# Patient Record
Sex: Male | Born: 1940 | Race: Black or African American | Hispanic: No | Smoking: Current some day smoker
Health system: Southern US, Community
[De-identification: ages and names within clinical notes are randomized; demographics above are authoritative.]

## PROBLEM LIST (undated history)

## (undated) DIAGNOSIS — N281 Cyst of kidney, acquired: Secondary | ICD-10-CM

## (undated) DIAGNOSIS — E119 Type 2 diabetes mellitus without complications: Secondary | ICD-10-CM

## (undated) DIAGNOSIS — D649 Anemia, unspecified: Secondary | ICD-10-CM

## (undated) DIAGNOSIS — K279 Peptic ulcer, site unspecified, unspecified as acute or chronic, without hemorrhage or perforation: Secondary | ICD-10-CM

## (undated) DIAGNOSIS — I10 Essential (primary) hypertension: Secondary | ICD-10-CM

## (undated) HISTORY — DX: Essential (primary) hypertension: I10

## (undated) HISTORY — DX: Peptic ulcer, site unspecified, unspecified as acute or chronic, without hemorrhage or perforation: K27.9

## (undated) HISTORY — DX: Type 2 diabetes mellitus without complications: E11.9

## (undated) HISTORY — DX: Anemia, unspecified: D64.9

## (undated) HISTORY — PX: NO PAST SURGERIES: SHX2092

---

## 1998-02-28 ENCOUNTER — Other Ambulatory Visit: Admission: RE | Admit: 1998-02-28 | Discharge: 1998-02-28 | Payer: Self-pay | Admitting: Internal Medicine

## 2009-06-30 ENCOUNTER — Emergency Department (HOSPITAL_COMMUNITY): Admission: EM | Admit: 2009-06-30 | Discharge: 2009-06-30 | Payer: Self-pay | Admitting: Emergency Medicine

## 2009-07-03 ENCOUNTER — Emergency Department (HOSPITAL_COMMUNITY): Admission: EM | Admit: 2009-07-03 | Discharge: 2009-07-03 | Payer: Self-pay | Admitting: Emergency Medicine

## 2011-01-17 ENCOUNTER — Other Ambulatory Visit: Payer: Self-pay | Admitting: Internal Medicine

## 2011-01-17 DIAGNOSIS — R140 Abdominal distension (gaseous): Secondary | ICD-10-CM

## 2011-01-21 ENCOUNTER — Other Ambulatory Visit: Payer: Self-pay | Admitting: Internal Medicine

## 2011-01-21 ENCOUNTER — Ambulatory Visit (HOSPITAL_COMMUNITY)
Admission: RE | Admit: 2011-01-21 | Discharge: 2011-01-21 | Disposition: A | Discharge status: Home / Self Care | Payer: BC Managed Care – PPO | Source: Ambulatory Visit | Attending: Internal Medicine | Admitting: Internal Medicine

## 2011-01-21 DIAGNOSIS — R142 Eructation: Secondary | ICD-10-CM | POA: Insufficient documentation

## 2011-01-21 DIAGNOSIS — R140 Abdominal distension (gaseous): Secondary | ICD-10-CM

## 2011-01-21 DIAGNOSIS — R143 Flatulence: Secondary | ICD-10-CM | POA: Insufficient documentation

## 2011-01-21 DIAGNOSIS — R141 Gas pain: Secondary | ICD-10-CM | POA: Insufficient documentation

## 2011-01-21 DIAGNOSIS — R109 Unspecified abdominal pain: Secondary | ICD-10-CM | POA: Insufficient documentation

## 2011-02-07 ENCOUNTER — Encounter: Payer: Self-pay | Admitting: *Deleted

## 2014-03-28 ENCOUNTER — Other Ambulatory Visit (HOSPITAL_COMMUNITY): Payer: Self-pay | Admitting: Internal Medicine

## 2014-03-28 DIAGNOSIS — M25562 Pain in left knee: Secondary | ICD-10-CM

## 2014-03-28 DIAGNOSIS — I4510 Unspecified right bundle-branch block: Secondary | ICD-10-CM

## 2014-04-05 ENCOUNTER — Ambulatory Visit (HOSPITAL_COMMUNITY)
Admission: RE | Admit: 2014-04-05 | Discharge: 2014-04-05 | Disposition: A | Discharge status: Home / Self Care | Payer: Medicare Other | Source: Ambulatory Visit | Attending: Internal Medicine | Admitting: Internal Medicine

## 2014-04-05 DIAGNOSIS — M25562 Pain in left knee: Secondary | ICD-10-CM

## 2014-04-05 DIAGNOSIS — R609 Edema, unspecified: Secondary | ICD-10-CM | POA: Insufficient documentation

## 2014-04-05 DIAGNOSIS — R0609 Other forms of dyspnea: Secondary | ICD-10-CM | POA: Insufficient documentation

## 2014-04-05 DIAGNOSIS — F17200 Nicotine dependence, unspecified, uncomplicated: Secondary | ICD-10-CM | POA: Insufficient documentation

## 2014-04-05 DIAGNOSIS — I517 Cardiomegaly: Secondary | ICD-10-CM

## 2014-04-05 DIAGNOSIS — M79609 Pain in unspecified limb: Secondary | ICD-10-CM

## 2014-04-05 DIAGNOSIS — R0989 Other specified symptoms and signs involving the circulatory and respiratory systems: Principal | ICD-10-CM | POA: Insufficient documentation

## 2014-04-05 DIAGNOSIS — I379 Nonrheumatic pulmonary valve disorder, unspecified: Secondary | ICD-10-CM | POA: Insufficient documentation

## 2014-04-05 DIAGNOSIS — I4510 Unspecified right bundle-branch block: Secondary | ICD-10-CM

## 2014-04-05 DIAGNOSIS — I10 Essential (primary) hypertension: Secondary | ICD-10-CM | POA: Insufficient documentation

## 2014-04-05 DIAGNOSIS — M109 Gout, unspecified: Secondary | ICD-10-CM | POA: Insufficient documentation

## 2014-04-05 NOTE — Progress Notes (Signed)
*  PRELIMINARY RESULTS* Echocardiogram 2D Echocardiogram has been performed.  Leavy Cella 04/05/2014, 9:34 AM

## 2014-04-05 NOTE — Progress Notes (Signed)
VASCULAR LAB PRELIMINARY  ARTERIAL  ABI completed:    RIGHT    LEFT    PRESSURE WAVEFORM  PRESSURE WAVEFORM  BRACHIAL 124 Triphasic BRACHIAL 127 Triphasic  DP 137 Triphasic DP 165 Triphasic  PT 168 Triphasic PT 159 Triphasic    RIGHT LEFT  ABI 1.32 1.30   Resting ABIs indicate normal arterial flow bilaterally. Patient was walked briskly only for a period of two minutes. He experienced hip pain at forty 42 seconds and was unable to walk after two minutes due to hip pain and lower leg pain and tightness. Immediate ABIs post exercise indicated normal arterial flow with no significant change for resting study. Results do not support claudication.  Alta Goding, RVS 04/05/2014, 10:19 AM

## 2014-05-16 ENCOUNTER — Other Ambulatory Visit: Payer: Self-pay | Admitting: Internal Medicine

## 2014-05-16 DIAGNOSIS — M5430 Sciatica, unspecified side: Secondary | ICD-10-CM

## 2014-06-01 ENCOUNTER — Ambulatory Visit (HOSPITAL_COMMUNITY)
Admission: RE | Admit: 2014-06-01 | Discharge: 2014-06-01 | Disposition: A | Discharge status: Home / Self Care | Payer: Medicare Other | Source: Ambulatory Visit | Attending: Internal Medicine | Admitting: Internal Medicine

## 2014-06-01 DIAGNOSIS — M47817 Spondylosis without myelopathy or radiculopathy, lumbosacral region: Secondary | ICD-10-CM | POA: Diagnosis not present

## 2014-06-01 DIAGNOSIS — M5430 Sciatica, unspecified side: Secondary | ICD-10-CM | POA: Diagnosis not present

## 2014-07-12 ENCOUNTER — Other Ambulatory Visit: Payer: Self-pay | Admitting: Internal Medicine

## 2014-07-12 DIAGNOSIS — N281 Cyst of kidney, acquired: Secondary | ICD-10-CM

## 2014-07-25 ENCOUNTER — Ambulatory Visit (HOSPITAL_COMMUNITY): Admission: RE | Admit: 2014-07-25 | Payer: Medicare Other | Source: Ambulatory Visit

## 2014-11-04 ENCOUNTER — Ambulatory Visit (HOSPITAL_COMMUNITY)
Admission: RE | Admit: 2014-11-04 | Discharge: 2014-11-04 | Disposition: A | Discharge status: Home / Self Care | Payer: Commercial Managed Care - HMO | Source: Ambulatory Visit | Attending: Internal Medicine | Admitting: Internal Medicine

## 2014-11-04 DIAGNOSIS — Q6102 Congenital multiple renal cysts: Secondary | ICD-10-CM | POA: Insufficient documentation

## 2014-11-04 DIAGNOSIS — E279 Disorder of adrenal gland, unspecified: Secondary | ICD-10-CM | POA: Diagnosis not present

## 2014-11-04 DIAGNOSIS — Q6100 Congenital renal cyst, unspecified: Secondary | ICD-10-CM | POA: Diagnosis present

## 2014-11-04 DIAGNOSIS — K7689 Other specified diseases of liver: Secondary | ICD-10-CM | POA: Diagnosis not present

## 2014-11-04 DIAGNOSIS — J9811 Atelectasis: Secondary | ICD-10-CM | POA: Insufficient documentation

## 2014-11-04 DIAGNOSIS — N281 Cyst of kidney, acquired: Secondary | ICD-10-CM

## 2014-11-04 LAB — CREATININE, SERUM
CREATININE: 1.55 mg/dL — AB (ref 0.50–1.35)
GFR, EST AFRICAN AMERICAN: 50 mL/min — AB (ref >90)
GFR, EST NON AFRICAN AMERICAN: 43 mL/min — AB (ref >90)

## 2014-11-04 MED ORDER — GADOBENATE DIMEGLUMINE 529 MG/ML IV SOLN
20.0000 mL | Freq: Once | INTRAVENOUS | Status: AC | PRN
  Administered 2014-11-04: 17 mL via INTRAVENOUS

## 2015-05-08 ENCOUNTER — Other Ambulatory Visit (HOSPITAL_COMMUNITY): Payer: Self-pay | Admitting: Urology

## 2015-05-08 DIAGNOSIS — N281 Cyst of kidney, acquired: Secondary | ICD-10-CM

## 2015-05-26 ENCOUNTER — Ambulatory Visit (HOSPITAL_COMMUNITY)
Admission: RE | Admit: 2015-05-26 | Discharge: 2015-05-26 | Disposition: A | Discharge status: Home / Self Care | Payer: Commercial Managed Care - HMO | Source: Ambulatory Visit | Attending: Urology | Admitting: Urology

## 2015-05-26 DIAGNOSIS — Q619 Cystic kidney disease, unspecified: Secondary | ICD-10-CM | POA: Diagnosis not present

## 2015-05-26 DIAGNOSIS — K760 Fatty (change of) liver, not elsewhere classified: Secondary | ICD-10-CM | POA: Insufficient documentation

## 2015-05-26 DIAGNOSIS — N281 Cyst of kidney, acquired: Secondary | ICD-10-CM | POA: Insufficient documentation

## 2015-05-26 DIAGNOSIS — N289 Disorder of kidney and ureter, unspecified: Secondary | ICD-10-CM | POA: Diagnosis present

## 2015-05-26 LAB — POCT I-STAT CREATININE: CREATININE: 1.7 mg/dL — AB (ref 0.61–1.24)

## 2015-05-26 MED ORDER — GADOBENATE DIMEGLUMINE 529 MG/ML IV SOLN
20.0000 mL | Freq: Once | INTRAVENOUS | Status: AC | PRN
  Administered 2015-05-26: 19 mL via INTRAVENOUS

## 2016-08-06 ENCOUNTER — Other Ambulatory Visit: Payer: Self-pay | Admitting: Urology

## 2016-08-06 DIAGNOSIS — N281 Cyst of kidney, acquired: Secondary | ICD-10-CM

## 2016-08-14 ENCOUNTER — Ambulatory Visit (HOSPITAL_COMMUNITY)
Admission: RE | Admit: 2016-08-14 | Discharge: 2016-08-14 | Disposition: A | Discharge status: Home / Self Care | Payer: Commercial Managed Care - HMO | Source: Ambulatory Visit | Attending: Urology | Admitting: Urology

## 2016-08-14 DIAGNOSIS — N281 Cyst of kidney, acquired: Secondary | ICD-10-CM | POA: Diagnosis present

## 2016-08-14 DIAGNOSIS — D3500 Benign neoplasm of unspecified adrenal gland: Secondary | ICD-10-CM | POA: Diagnosis not present

## 2016-08-14 DIAGNOSIS — K7689 Other specified diseases of liver: Secondary | ICD-10-CM | POA: Insufficient documentation

## 2016-08-14 MED ORDER — GADOBENATE DIMEGLUMINE 529 MG/ML IV SOLN
20.0000 mL | Freq: Once | INTRAVENOUS | Status: AC | PRN
  Administered 2016-08-14: 20 mL via INTRAVENOUS

## 2017-04-16 ENCOUNTER — Other Ambulatory Visit: Payer: Self-pay | Admitting: Urology

## 2017-04-16 DIAGNOSIS — N281 Cyst of kidney, acquired: Secondary | ICD-10-CM

## 2017-04-29 ENCOUNTER — Ambulatory Visit (HOSPITAL_COMMUNITY)
Admission: RE | Admit: 2017-04-29 | Discharge: 2017-04-29 | Disposition: A | Discharge status: Home / Self Care | Payer: Medicare HMO | Source: Ambulatory Visit | Attending: Urology | Admitting: Urology

## 2017-04-29 DIAGNOSIS — N281 Cyst of kidney, acquired: Secondary | ICD-10-CM | POA: Insufficient documentation

## 2017-04-29 DIAGNOSIS — K7689 Other specified diseases of liver: Secondary | ICD-10-CM | POA: Insufficient documentation

## 2017-04-29 LAB — POCT I-STAT CREATININE: Creatinine, Ser: 1.5 mg/dL — ABNORMAL HIGH (ref 0.61–1.24)

## 2017-04-29 MED ORDER — GADOBENATE DIMEGLUMINE 529 MG/ML IV SOLN
20.0000 mL | Freq: Once | INTRAVENOUS | Status: AC | PRN
  Administered 2017-04-29: 20 mL via INTRAVENOUS

## 2017-05-18 ENCOUNTER — Encounter (HOSPITAL_COMMUNITY): Payer: Self-pay | Admitting: Emergency Medicine

## 2017-05-18 ENCOUNTER — Emergency Department (HOSPITAL_COMMUNITY): Payer: Medicare HMO

## 2017-05-18 ENCOUNTER — Emergency Department (HOSPITAL_COMMUNITY)
Admission: EM | Admit: 2017-05-18 | Discharge: 2017-05-18 | Disposition: A | Discharge status: Home / Self Care | Payer: Medicare HMO | Attending: Emergency Medicine | Admitting: Emergency Medicine

## 2017-05-18 DIAGNOSIS — F17200 Nicotine dependence, unspecified, uncomplicated: Secondary | ICD-10-CM | POA: Insufficient documentation

## 2017-05-18 DIAGNOSIS — K5900 Constipation, unspecified: Secondary | ICD-10-CM | POA: Insufficient documentation

## 2017-05-18 DIAGNOSIS — Z79899 Other long term (current) drug therapy: Secondary | ICD-10-CM | POA: Diagnosis not present

## 2017-05-18 DIAGNOSIS — K6289 Other specified diseases of anus and rectum: Secondary | ICD-10-CM | POA: Diagnosis not present

## 2017-05-18 HISTORY — DX: Cyst of kidney, acquired: N28.1

## 2017-05-18 LAB — CBC WITH DIFFERENTIAL/PLATELET
Basophils Absolute: 0 10*3/uL (ref 0.0–0.1)
Basophils Relative: 0 %
EOS PCT: 1 %
Eosinophils Absolute: 0.1 10*3/uL (ref 0.0–0.7)
HCT: 35.8 % — ABNORMAL LOW (ref 39.0–52.0)
Hemoglobin: 11.9 g/dL — ABNORMAL LOW (ref 13.0–17.0)
LYMPHS PCT: 12 %
Lymphs Abs: 1.5 10*3/uL (ref 0.7–4.0)
MCH: 25.7 pg — ABNORMAL LOW (ref 26.0–34.0)
MCHC: 33.2 g/dL (ref 30.0–36.0)
MCV: 77.3 fL — ABNORMAL LOW (ref 78.0–100.0)
Monocytes Absolute: 0.9 10*3/uL (ref 0.1–1.0)
Monocytes Relative: 8 %
Neutro Abs: 9.4 10*3/uL — ABNORMAL HIGH (ref 1.7–7.7)
Neutrophils Relative %: 79 %
PLATELETS: 273 10*3/uL (ref 150–400)
RBC: 4.63 MIL/uL (ref 4.22–5.81)
RDW: 13.8 % (ref 11.5–15.5)
WBC: 11.9 10*3/uL — AB (ref 4.0–10.5)

## 2017-05-18 LAB — COMPREHENSIVE METABOLIC PANEL
ALT: 18 U/L (ref 17–63)
AST: 17 U/L (ref 15–41)
Albumin: 3.5 g/dL (ref 3.5–5.0)
Alkaline Phosphatase: 68 U/L (ref 38–126)
Anion gap: 7 (ref 5–15)
BUN: 34 mg/dL — ABNORMAL HIGH (ref 6–20)
CHLORIDE: 108 mmol/L (ref 101–111)
CO2: 22 mmol/L (ref 22–32)
CREATININE: 1.73 mg/dL — AB (ref 0.61–1.24)
Calcium: 9.4 mg/dL (ref 8.9–10.3)
GFR calc Af Amer: 43 mL/min — ABNORMAL LOW (ref >60)
GFR calc non Af Amer: 37 mL/min — ABNORMAL LOW (ref >60)
Glucose, Bld: 109 mg/dL — ABNORMAL HIGH (ref 65–99)
Potassium: 3.9 mmol/L (ref 3.5–5.1)
SODIUM: 137 mmol/L (ref 135–145)
Total Bilirubin: 0.7 mg/dL (ref 0.3–1.2)
Total Protein: 7.4 g/dL (ref 6.5–8.1)

## 2017-05-18 MED ORDER — MILK AND MOLASSES ENEMA
1.0000 | Freq: Once | RECTAL | Status: AC
  Administered 2017-05-18: 250 mL via RECTAL
  Filled 2017-05-18: qty 250

## 2017-05-18 NOTE — Discharge Instructions (Signed)
Drink plenty of fluids to prevent constipation. Use miralax, on cap daily until normal bowel movements. Return to ED if worsening symptoms.

## 2017-05-18 NOTE — ED Notes (Signed)
Patient transported to X-ray 

## 2017-05-18 NOTE — ED Provider Notes (Signed)
[facility] DEPT Provider Note   CSN: [id number] Arrival date & time: 05/18/17  1011     History   Chief Complaint Chief Complaint  Patient presents with  . Constipation  . Hemorrhoids    HPI Raymond Pope is a 75 y.o. male.  HPI Raymond Pope is a 76 y.o. male with history of hypertension,recently diagnosed diabetes, history of renal cysts, presents to emergency department complaining of constipation.atient states that he has felt bloated and felt pressure in his abdomen for about a week. He states he only had 1 small bowel movement over the last week. He has tried a few sips of magnesium citrate which did not help. He denies any nausea or vomiting. He states he does have decreased appetite because he feels full. He reports some rectal pain and thinks he may have hemorrhoids. Denies any blood in his stool. Denies blood when he wipes. No urinary symptoms. No fever or chills. Drinks alcohol occasionally. Does not take any opiate medications. No change in his recent medications other than new diabetes medicine a month ago.  Past Medical History:  Diagnosis Date  . Renal cyst     There are no active problems to display for this patient.   History reviewed. No pertinent surgical history.     Home Medications    Prior to Admission medications   Medication Sig Start Date End Date Taking? Authorizing Provider  amLODipine-valsartan (EXFORGE) 10-160 MG per tablet Take 1 tablet by mouth daily.      [provider]  dexlansoprazole (DEXILANT) 60 MG capsule Take 60 mg by mouth daily.      [provider]  Tamsulosin HCl (FLOMAX) 0.4 MG CAPS Take 0.4 mg by mouth 1 dose over 24 hours.      [provider]    Family History History reviewed. No pertinent family history.  Social History Social History  Substance Use Topics  . Smoking status: Current Every Day Smoker    Types: Pipe  . Smokeless tobacco: Never Used  . Alcohol use No      Allergies   Patient has no known allergies.   Review of Systems Review of Systems  Constitutional: Negative for chills and fever.  Respiratory: Negative for cough, chest tightness and shortness of breath.   Cardiovascular: Negative for chest pain, palpitations and leg swelling.  Gastrointestinal: Positive for abdominal distention, abdominal pain, constipation and rectal pain. Negative for blood in stool, diarrhea, nausea and vomiting.  Genitourinary: Negative for dysuria, frequency, hematuria and urgency.  Musculoskeletal: Negative for arthralgias, myalgias, neck pain and neck stiffness.  Skin: Negative for rash.  Allergic/Immunologic: Negative for immunocompromised state.  Neurological: Negative for dizziness, weakness, light-headedness, numbness and headaches.  All other systems reviewed and are negative.    Physical Exam Updated Vital Signs BP 132/88   Pulse 87   Temp 98.1 F (36.7 C)   Resp 18   SpO2 99%   Physical Exam  Constitutional: He appears well-developed and well-nourished. No distress.  HENT:  Head: Normocephalic and atraumatic.  Eyes: Conjunctivae are normal.  Neck: Neck supple.  Cardiovascular: Normal rate, regular rhythm and normal heart sounds.   Pulmonary/Chest: Effort normal. No respiratory distress. He has no wheezes. He has no rales.  Abdominal: Soft. Bowel sounds are normal. He exhibits distension. There is no tenderness. There is no rebound.  Musculoskeletal: He exhibits no edema.  Neurological: He is alert.  Skin: Skin is warm and dry.  Nursing note and vitals reviewed.  ED Treatments / Results  Labs (all labs ordered are listed, but only abnormal results are displayed) Labs Reviewed  CBC WITH DIFFERENTIAL/PLATELET  COMPREHENSIVE METABOLIC PANEL    EKG  EKG Interpretation None       Radiology No results found.  Procedures Procedures (including critical care time)  Medications Ordered in ED Medications - No data to  display   Initial Impression / Assessment and Plan / ED Course  I have reviewed the triage vital signs and the nursing notes.  Pertinent labs & imaging results that were available during my care of the patient were reviewed by me and considered in my medical decision making (see chart for details).     Patient seen and examined. Patient with constipation. Abdomen is nontender does appear to be distended. We'll get an abdominal x-ray and perform rectal exam. Will get basic labs due to his age to assess for dehydration, electrolyte abnormalities, blood counts.   2:00 PM Abdominal x-ray showing mild fecal loading in the colon, rectal exam showed fecal impaction. I'm unable to disimpact due to pain to discomfort. We'll try an enema.  3:22 PM And received an enema, he had a large bowel movement. He states he feels much better. When I went to reassess him he was sitting on the site chair fully dressed stating that he is ready to go home and he feels back to normal. His abdomen is soft. Non tender. Discussed with Dr. Regenia Skeeter who has seen patient as well, agrees with plan to discharge home. Advised to take MiraLAX daily until normal bowel movements. Follow-up with family doctor. Return precautions discussed.  Vitals:   05/18/17 1018 05/18/17 1220 05/18/17 1422  BP: 133/85 132/88 122/83  Pulse: (!) 103 87 81  Resp: 15 18 16   Temp: 98.1 F (36.7 C)    SpO2: 99% 99% 100%      Final Clinical Impressions(s) / ED Diagnoses   Final diagnoses:  Constipation  Constipation, unspecified constipation type    New Prescriptions New Prescriptions   No medications on file     Jeannett Senior, Hershal Coria 05/18/17 2039    Sherwood Gambler, MD 05/20/17 1226

## 2017-05-18 NOTE — ED Triage Notes (Signed)
Pt c/o constipation and hemorrhoids, patient states he has had one bowel movement in the past week. Pt states abdomen feels bloated and tightness throughout abdomen.

## 2017-05-19 ENCOUNTER — Encounter: Payer: Medicare HMO | Attending: Internal Medicine | Admitting: Skilled Nursing Facility1

## 2017-05-19 ENCOUNTER — Encounter: Payer: Self-pay | Admitting: Skilled Nursing Facility1

## 2017-05-19 DIAGNOSIS — E118 Type 2 diabetes mellitus with unspecified complications: Secondary | ICD-10-CM | POA: Diagnosis present

## 2017-05-19 DIAGNOSIS — E119 Type 2 diabetes mellitus without complications: Secondary | ICD-10-CM

## 2017-05-19 NOTE — Patient Instructions (Addendum)
-  Use splenda in coffee and lemonade   -Use diet soda instead of regular   -Before you eat anything in the morning: around 130  -2 hours after you eat around 180  -Below 70 is low fix it!  -High over 200 blood sugar  -Check your blood sugar 1 time a  Day either in the morning before you eat anything or 2 hours after you have eaten a meal  -Write down your numbers you get, if they are out of range right down what you ate 2 hours before   -Start trying to eat something for breakfast every day

## 2017-05-19 NOTE — Progress Notes (Signed)
Diabetes Self-Management Education  Visit Type: First/Initial   05/19/2017  Mr. Raymond Pope, identified by name and date of birth, is a 76 y.o. male with a diagnosis of Diabetes: Type 2.   ASSESSMENT  Height 5\' 11"  (1.803 m), weight 210 lb (95.3 kg). Body mass index is 29.29 kg/m.   Pt arrives with his daughter who has type 2 diabetes and fell asleep a couple times in the appointment one time her father waking her up. Pt has trouble deciphering consitancy. Pt states he wakes up in the middle of the night to eat sometimes because he wakes up in the middle of the night to urinate.  Pt checked his blood sugar: 110 having eaten some sausage.  Pt given accu-chek guide lot: [id number] exp: 12-18-17  To discuss next time: type 1 verses type 2, CHO counting, and reviewing of blood glucose log.      Diabetes Self-Management Education - 05/19/17 1035      Visit Information   Visit Type First/Initial     Initial Visit   Diabetes Type Type 2   Are you currently following a meal plan? No   Are you taking your medications as prescribed? Yes     Health Coping   How would you rate your overall health? Good     Psychosocial Assessment   Patient Belief/Attitude about Diabetes Motivated to manage diabetes   Self-management support Family   Other persons present Family Member   Patient Concerns Nutrition/Meal planning     Pre-Education Assessment   Patient understands the diabetes disease and treatment process. Needs Instruction   Patient understands incorporating nutritional management into lifestyle. Needs Instruction   Patient undertands incorporating physical activity into lifestyle. Needs Instruction   Patient understands using medications safely. Needs Instruction   Patient understands monitoring blood glucose, interpreting and using results Needs Instruction   Patient understands prevention, detection, and treatment of acute complications. Needs Instruction   Patient understands  prevention, detection, and treatment of chronic complications. Needs Instruction   Patient understands how to develop strategies to address psychosocial issues. Needs Instruction   Patient understands how to develop strategies to promote health/change behavior. Needs Instruction     Complications   Last HgB A1C per patient/outside source 9.5 %   How often do you check your blood sugar? 0 times/day (not testing)   Have you had a dilated eye exam in the past 12 months? No   Have you had a dental exam in the past 12 months? No   Are you checking your feet? No     Dietary Intake   Breakfast none   Lunch 12pm: chicken or pork chop or sausage, beans somtimes green leafys mostly just meat and beans    Snack (afternoon) peanutbutter and oatmeal cookies or bread    Dinner meat and beans    Snack (evening) somtimes in the middle of the night eating    Beverage(s) water, regular clear soda, coffee cream and sugar, lemonade     Patient Education   Previous Diabetes Education No   Nutrition management  Role of diet in the treatment of diabetes and the relationship between the three main macronutrients and blood glucose level;Carbohydrate counting;Reviewed blood glucose goals for pre and post meals and how to evaluate the patients' food intake on their blood glucose level.   Physical activity and exercise  Role of exercise on diabetes management, blood pressure control and cardiac health.   Monitoring Taught/evaluated SMBG meter.;Purpose and frequency of SMBG.;Identified appropriate  SMBG and/or A1C goals.     Outcomes   Expected Outcomes Demonstrated interest in learning. Expect positive outcomes   Future DMSE 2 wks   Program Status Completed      Individualized Plan for Diabetes Self-Management Training:   Learning Objective:  Patient will have a greater understanding of diabetes self-management. Patient education plan is to attend individual and/or group sessions per assessed needs and  concerns.   Plan:   Patient Instructions  -Use splenda in coffee and lemonade   -Use diet soda instead of regular   -Before you eat anything in the morning: around 130  -2 hours after you eat around 180  -Below 70 is low fix it!  -High over 200 blood sugar  -Check your blood sugar 1 time a  Day either in the morning before you eat anything or 2 hours after you have eaten a meal  -Write down your numbers you get, if they are out of range right down what you ate 2 hours before   -Start trying to eat something for breakfast every day   Expected Outcomes:  Demonstrated interest in learning. Expect positive outcomes  Education material provided: Living Well with Diabetes and A1C conversion sheet  If problems or questions, patient to contact team via:  Phone  Future DSME appointment: 2 wks

## 2017-05-20 ENCOUNTER — Ambulatory Visit: Payer: Commercial Managed Care - HMO

## 2017-05-27 ENCOUNTER — Ambulatory Visit: Payer: Commercial Managed Care - HMO

## 2017-06-03 ENCOUNTER — Ambulatory Visit: Payer: Commercial Managed Care - HMO

## 2017-06-04 ENCOUNTER — Encounter: Payer: Self-pay | Admitting: Skilled Nursing Facility1

## 2017-06-04 ENCOUNTER — Encounter: Payer: Medicare HMO | Attending: Internal Medicine | Admitting: Skilled Nursing Facility1

## 2017-06-04 DIAGNOSIS — E119 Type 2 diabetes mellitus without complications: Secondary | ICD-10-CM

## 2017-06-04 DIAGNOSIS — E118 Type 2 diabetes mellitus with unspecified complications: Secondary | ICD-10-CM | POA: Insufficient documentation

## 2017-06-04 NOTE — Patient Instructions (Addendum)
-  Check your blood sugar in the morning before you eat anything every Monday and Friday  -And then all the other days check 2 hours after a meal  -Yogurt: 15 grams or less of carbohydrate, 9 or less grams of fat and 9 or less grams of sugar, 8 grams or more of protein

## 2017-06-04 NOTE — Progress Notes (Signed)
  Diabetes Self-Management Education   06/04/2017  Mr. Raymond Pope, identified by name and date of birth, is a 76 y.o. male with a diagnosis of Diabetes:  .     Pt arrives with his daughter. Pts daughter states she checks her fathers blood sugar for him numbers tracked: 181, 109, 119, 209, 110 (pt could not answer when these numbers were taken. Pts daughter states they did not take her fathers blood sugar often because they did not know how to use the pts prescribed lancing device. When dietitian asked a series of questions to better understand what the bridge was that needed to be gapped the pts daughter said she did not like this line of questions: Dietitian responded she needed to know what they did not understand so she could help them, the daughter then got less defensive and answered the questions. Pt and his daughter were taught how to use his meter, carbohydrate counting as well as hypoglycemia prevention and treatment. Pt states all he wants to do is control his diabetes so he will do carbohydrate counting and do it well. Pts daughter states she cannot get over to her fathers house to test his fasting sugars, when dietitian asked the pt if he could test his fasting he said absolutely. Pt states he gets up in the middle of the night to have a snack.  To discuss next time: log review and checking feet  Goals: -Check your blood sugar in the morning before you eat anything every Monday and Friday -And then all the other days check 2 hours after a meal -Yogurt: 15 grams or less of carbohydrate, 9 or less grams of fat and 9 or less grams of sugar, 8 grams or more of protein -Bring your log of blood sugars for the next appointment

## 2017-07-16 ENCOUNTER — Encounter: Payer: Medicare HMO | Attending: Internal Medicine | Admitting: Skilled Nursing Facility1

## 2017-07-16 ENCOUNTER — Encounter: Payer: Self-pay | Admitting: Skilled Nursing Facility1

## 2017-07-16 DIAGNOSIS — E119 Type 2 diabetes mellitus without complications: Secondary | ICD-10-CM

## 2017-07-16 DIAGNOSIS — E118 Type 2 diabetes mellitus with unspecified complications: Secondary | ICD-10-CM | POA: Diagnosis not present

## 2017-07-16 NOTE — Progress Notes (Signed)
  Diabetes Self-Management Education   07/16/2017  Mr. Raymond Pope, identified by name and date of birth, is a 76 y.o. male with a diagnosis of Diabetes:  .   To discuss next time:checking feet and smoking  Pt states he ran out of janumet for about a week.  Glucose readings: 140-183 2 hours after eating. Pt did have a few over 200 not remembering what happened on those days. Pt states he took four flights of stairs for today's appointment. Pt states he skips breakfast most days of the week. Pt admits to taking half a janumet every other day due to how expensive the meds are.   Pt does smoke.   Dietary Recall: Sundays: preparing meals for 2-3 days into the week: [place] necks with rice, fresh greens and lima beans   Beverages: coffee, lemonade   Goals:  -Try to eat within 1 to 1.5 hours of waking: fruit and nuts OR fruit and peanut butter OR fruit and cheese OR a slice of cheese toast OR yogurt   -Talk to your doctor about your medication being too expensive   -Try to eat about every 5 hours   -Take your medication as prescribed until you can get in with your doctor   -For your lemonade read the nutrition facts label and look for 9 grams or less of sugar   -Look for sugar free on your lemonade front label   -when your numbers are over 200 write down what you had to eat a couple hours before that and whether you had a stressful day or not

## 2017-07-16 NOTE — Patient Instructions (Addendum)
-  Try to eat within 1 to 1.5 hours of waking: fruit and nuts OR fruit and peanut butter OR fruit and cheese OR a slice of cheese toast OR yogurt   -Talk to your doctor about your medication being too expensive   -Try to eat about every 5 hours   -Take your medication as prescribed until you can get in with your doctor   -For your lemonade read the nutrition facts label and look for 9 grams or less of sugar   -Look for sugar free on your lemonade front label   -when your numbers are over 200 write down what you had to eat a couple hours before that and whether you had a stressful day or not

## 2017-10-16 ENCOUNTER — Encounter: Payer: Medicare HMO | Attending: Internal Medicine | Admitting: Skilled Nursing Facility1

## 2017-10-16 ENCOUNTER — Encounter: Payer: Self-pay | Admitting: Skilled Nursing Facility1

## 2017-10-16 DIAGNOSIS — E118 Type 2 diabetes mellitus with unspecified complications: Secondary | ICD-10-CM | POA: Diagnosis not present

## 2017-10-16 DIAGNOSIS — Z713 Dietary counseling and surveillance: Secondary | ICD-10-CM | POA: Diagnosis present

## 2017-10-16 DIAGNOSIS — E119 Type 2 diabetes mellitus without complications: Secondary | ICD-10-CM

## 2017-10-16 NOTE — Progress Notes (Signed)
  Diabetes Self-Management Education   10/16/2017  Mr. Raymond Pope, identified by name and date of birth, is a 77 y.o. male with a diagnosis of Diabetes:  .    Pt states he has been working on lower priced medications with his Building surveyor. Pt states his doctor is retiring so he is trying to pick out a doctor from [place]. Pt states he sometimes feels dizzy in the morning. Pt states he notices every once and while he notices tingling in his feet. Pt states he has to urinate often throughout the night stating he drinks up until he goes to bed. Pt was surprised at the effects smoking can have on his body and states he will try to cut back.   Pts blood sugars: not knowing if they are before or after eating: 130-294 Had pt write down, "if my numbers are over 200 I will write down what I ate that day and if that day was particularly stressful I will also drink 2 glasses of water and march in place for 10 minutes" also had pt write down weather it was before he has eaten or after he has eaten. Also check my blood sugar in the morning when I am feeling dizzy and if low have fast acting sugar. Also, get an appointment with a podiatrist.  Dietary Recall: eating fresh vegetables from his garden throughout the day  Cereal or 2 eggs and 2 toast Coffee or hot chocolate [place] necks and rice or neck bones  [place] necks and rice or neck bones   Beverages: coffee, lemonade, sugar free natures twist, water 80 oz

## 2017-12-12 ENCOUNTER — Other Ambulatory Visit: Payer: Self-pay | Admitting: Urology

## 2017-12-12 DIAGNOSIS — N281 Cyst of kidney, acquired: Secondary | ICD-10-CM

## 2017-12-15 ENCOUNTER — Encounter: Payer: Self-pay | Admitting: Family Medicine

## 2017-12-15 ENCOUNTER — Ambulatory Visit (INDEPENDENT_AMBULATORY_CARE_PROVIDER_SITE_OTHER): Payer: Medicare HMO | Admitting: Family Medicine

## 2017-12-15 VITALS — BP 122/80 | HR 96 | Ht 71.0 in | Wt 210.2 lb

## 2017-12-15 DIAGNOSIS — Z23 Encounter for immunization: Secondary | ICD-10-CM | POA: Diagnosis not present

## 2017-12-15 DIAGNOSIS — E119 Type 2 diabetes mellitus without complications: Secondary | ICD-10-CM | POA: Insufficient documentation

## 2017-12-15 DIAGNOSIS — I10 Essential (primary) hypertension: Secondary | ICD-10-CM | POA: Diagnosis not present

## 2017-12-15 DIAGNOSIS — Z720 Tobacco use: Secondary | ICD-10-CM

## 2017-12-15 DIAGNOSIS — N400 Enlarged prostate without lower urinary tract symptoms: Secondary | ICD-10-CM

## 2017-12-15 NOTE — Progress Notes (Signed)
Subjective:  Patient ID: Raymond Pope, male    DOB: [birth year]  Age: 77 y.o. MRN: [id number]  CC: Establish Care   HPI Raymond Pope presents for establishment of care and evaluation of his hypertension, diabetes, BPH.  His controlled on his current medical regimen regimen he tells me.  He is nonfasting today.  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  An MRI has been scheduled for follow-up of these in June of this year.  He has been smoking a pipe for 20 years and is interested in quitting smoking.  He is retired from work with [employer].  He lives alone.  There are grown children in the area who come by and check on him.  He rarely drinks alcohol and does not use illicit drugs.  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  He brings in a list of blood sugars but are mostly 2 hours post lunch and they are in the less than 150 range.  He is currently not having any problems with any of his medicines.  History Raymond Pope has a past medical history of Diabetes mellitus without complication ([place]) and Renal cyst.   He has no past surgical history on file.   His family history is not on file.He reports that he has been smoking pipe.  he has never used smokeless tobacco. He reports that he does not drink alcohol or use drugs.  Outpatient Medications Prior to Visit  Medication Sig Dispense Refill  . amLODipine (NORVASC) 10 MG tablet Take 10 mg by mouth daily.     Marland Kitchen aspirin EC 81 MG tablet Take 81 mg by mouth daily.    Marland Kitchen losartan-hydrochlorothiazide (HYZAAR) 100-12.5 MG tablet Take 1 tablet by mouth daily.     . SitaGLIPtin-MetFORMIN HCl (JANUMET XR) 50-500 MG TB24 Take 1 tablet by mouth 2 (two) times daily.     . Tamsulosin HCl (FLOMAX) 0.4 MG CAPS Take 0.4 mg by mouth 1 dose over 24 hours.      Marland Kitchen ACCU-CHEK AVIVA PLUS test strip   0  . ACCU-CHEK SOFTCLIX LANCETS lancets   0  .  amLODipine-valsartan (EXFORGE) 10-160 MG per tablet Take 1 tablet by mouth daily.      Marland Kitchen dexlansoprazole (DEXILANT) 60 MG capsule Take 60 mg by mouth daily.       No facility-administered medications prior to visit.     ROS Review of Systems  Constitutional: Negative for chills, fatigue and unexpected weight change.  HENT: Negative.   Eyes: Negative.   Respiratory: Negative.   Cardiovascular: Negative.   Gastrointestinal: Negative.   Endocrine: Negative for polyphagia.  Allergic/Immunologic: Negative for immunocompromised state.  Neurological: Negative for weakness and headaches.  Hematological: Does not bruise/bleed easily.  Psychiatric/Behavioral: Negative.     Objective:  BP 122/80 (BP Location: Left Arm, Patient Position: Sitting, Cuff Size: Normal)   Pulse 96   Ht 5\' 11"  (1.803 m)   Wt 210 lb 4 oz (95.4 kg)   SpO2 97%   BMI 29.32 kg/m   Physical Exam  Constitutional: He is oriented to person, place, and time. He appears well-developed and well-nourished. No distress.  HENT:  Head: Normocephalic and atraumatic.  Right Ear: External ear normal.  Left Ear: External ear normal.  Eyes: Right eye exhibits no discharge. Left eye exhibits no discharge. No scleral icterus.  Neck: No JVD present. No tracheal deviation present.  Pulmonary/Chest:  Effort normal. No stridor.  Neurological: He is alert and oriented to person, place, and time.  Skin: Skin is warm and dry. He is not diaphoretic.  Psychiatric: He has a normal mood and affect. His behavior is normal.      Assessment & Plan:   Raymond Pope was seen today for establish care.  Diagnoses and all orders for this visit:  Essential hypertension  Controlled type 2 diabetes mellitus without complication, without long-term current use of insulin (HCC)  Benign prostatic hyperplasia, unspecified whether lower urinary tract symptoms present  Tobacco use   I have discontinued Raymond Pope's amLODipine-valsartan,  dexlansoprazole, ACCU-CHEK AVIVA PLUS, and ACCU-CHEK SOFTCLIX LANCETS. I am also having him maintain his tamsulosin, amLODipine, losartan-hydrochlorothiazide, aspirin EC, and SitaGLIPtin-MetFORMIN HCl.  No orders of the defined types were placed in this encounter.  He will be scheduled to return fasting for a complete physical exam.  We had a discussion on the importance of stopping tobacco use.  He said help with this and using lozenges and/or gum.  Information was also given to him about quitting smoking and the importance of quitting smoking for his long-term health.  Follow-up: Return will return for physical.  Libby Maw, MD

## 2017-12-15 NOTE — Patient Instructions (Addendum)
Health Risks of Smoking Smoking cigarettes is very bad for your health. Tobacco smoke has over 200 known poisons in it. It contains the poisonous gases nitrogen oxide and carbon monoxide. There are over 60 chemicals in tobacco smoke that cause cancer. Smoking is difficult to quit because a chemical in tobacco, called nicotine, causes addiction or dependence. When you smoke and inhale, nicotine is absorbed rapidly into the bloodstream through your lungs. Both inhaled and non-inhaled nicotine may be addictive. What are the risks of cigarette smoke? Cigarette smokers have an increased risk of many serious medical problems, including:  Lung cancer.  Lung disease, such as pneumonia, bronchitis, and emphysema.  Chest pain (angina) and heart attack because the heart is not getting enough oxygen.  Heart disease and peripheral blood vessel disease.  High blood pressure (hypertension).  Stroke.  Oral cancer, including cancer of the lip, mouth, or voice box.  Bladder cancer.  Pancreatic cancer.  Cervical cancer.  Pregnancy complications, including premature birth.  Stillbirths and smaller newborn babies, birth defects, and genetic damage to sperm.  Early menopause.  Lower estrogen level for women.  Infertility.  Facial wrinkles.  Blindness.  Increased risk of broken bones (fractures).  Senile dementia.  Stomach ulcers and internal bleeding.  Delayed wound healing and increased risk of complications during surgery.  Even smoking lightly shortens your life expectancy by several years.  Because of secondhand smoke exposure, children of smokers have an increased risk of the following:  Sudden infant death syndrome (SIDS).  Respiratory infections.  Lung cancer.  Heart disease.  Ear infections.  What are the benefits of quitting? There are many health benefits of quitting smoking. Here are some of them:  Within days of quitting smoking, your risk of having a heart  attack decreases, your blood flow improves, and your lung capacity improves. Blood pressure, pulse rate, and breathing patterns start returning to normal soon after quitting.  Within months, your lungs may clear up completely.  Quitting for 10 years reduces your risk of developing lung cancer and heart disease to almost that of a nonsmoker.  People who quit may see an improvement in their overall quality of life.  How do I quit smoking? Smoking is an addiction with both physical and psychological effects, and longtime habits can be hard to change. Your health care provider can recommend:  Programs and community resources, which may include group support, education, or talk therapy.  Prescription medicines to help reduce cravings.  Nicotine replacement products, such as patches, gum, and nasal sprays. Use these products only as directed. Do not replace cigarette smoking with electronic cigarettes, which are commonly called e-cigarettes. The safety of e-cigarettes is not known, and some may contain harmful chemicals.  A combination of two or more of these methods.  Where to find more information:  American Lung Association: www.lung.org  American Cancer Society: www.cancer.org Summary  Smoking cigarettes is very bad for your health. Cigarette smokers have an increased risk of many serious medical problems, including several cancers, heart disease, and stroke.  Smoking is an addiction with both physical and psychological effects, and longtime habits can be hard to change.  By stopping right away, you can greatly reduce the risk of medical problems for you and your family.  To help you quit smoking, your health care provider can recommend programs, community resources, prescription medicines, and nicotine replacement products such as patches, gum, and nasal sprays. This information is not intended to replace advice given to you by your health   care provider. Make sure you discuss any  questions you have with your health care provider. Document Released: 10/24/2004 Document Revised: 09/20/2016 Document Reviewed: 09/20/2016 Elsevier Interactive Patient Education  2017 [employer].  Steps to Quit Smoking Smoking tobacco can be harmful to your health and can affect almost every organ in your body. Smoking puts you, and those around you, at risk for developing many serious chronic diseases. Quitting smoking is difficult, but it is one of the best things that you can do for your health. It is never too late to quit. What are the benefits of quitting smoking? When you quit smoking, you lower your risk of developing serious diseases and conditions, such as:  Lung cancer or lung disease, such as COPD.  Heart disease.  Stroke.  Heart attack.  Infertility.  Osteoporosis and bone fractures.  Additionally, symptoms such as coughing, wheezing, and shortness of breath may get better when you quit. You may also find that you get sick less often because your body is stronger at fighting off colds and infections. If you are pregnant, quitting smoking can help to reduce your chances of having a baby of low birth weight. How do I get ready to quit? When you decide to quit smoking, create a plan to make sure that you are successful. Before you quit:  Pick a date to quit. Set a date within the next two weeks to give you time to prepare.  Write down the reasons why you are quitting. Keep this list in places where you will see it often, such as on your bathroom mirror or in your car or wallet.  Identify the people, places, things, and activities that make you want to smoke (triggers) and avoid them. Make sure to take these actions: ? Throw away all cigarettes at home, at work, and in your car. ? Throw away smoking accessories, such as Scientist, research (medical). ? Clean your car and make sure to empty the ashtray. ? Clean your home, including curtains and carpets.  Tell your family,  friends, and coworkers that you are quitting. Support from your loved ones can make quitting easier.  Talk with your health care provider about your options for quitting smoking.  Find out what treatment options are covered by your health insurance.  What strategies can I use to quit smoking? Talk with your healthcare provider about different strategies to quit smoking. Some strategies include:  Quitting smoking altogether instead of gradually lessening how much you smoke over a period of time. Research shows that quitting "cold [place]" is more successful than gradually quitting.  Attending in-person counseling to help you build problem-solving skills. You are more likely to have success in quitting if you attend several counseling sessions. Even short sessions of 10 minutes can be effective.  Finding resources and support systems that can help you to quit smoking and remain smoke-free after you quit. These resources are most helpful when you use them often. They can include: ? Online chats with a Social worker. ? Telephone quitlines. ? Careers information officer. ? Support groups or group counseling. ? Text messaging programs. ? Mobile phone applications.  Taking medicines to help you quit smoking. (If you are pregnant or breastfeeding, talk with your health care provider first.) Some medicines contain nicotine and some do not. Both types of medicines help with cravings, but the medicines that include nicotine help to relieve withdrawal symptoms. Your health care provider may recommend: ? Nicotine patches, gum, or lozenges. ? Nicotine inhalers or sprays. ?  Non-nicotine medicine that is taken by mouth.  Talk with your health care provider about combining strategies, such as taking medicines while you are also receiving in-person counseling. Using these two strategies together makes you more likely to succeed in quitting than if you used either strategy on its own. If you are pregnant or  breastfeeding, talk with your health care provider about finding counseling or other support strategies to quit smoking. Do not take medicine to help you quit smoking unless told to do so by your health care provider. What things can I do to make it easier to quit? Quitting smoking might feel overwhelming at first, but there is a lot that you can do to make it easier. Take these important actions:  Reach out to your family and friends and ask that they support and encourage you during this time. Call telephone quitlines, reach out to support groups, or work with a counselor for support.  Ask people who smoke to avoid smoking around you.  Avoid places that trigger you to smoke, such as bars, parties, or smoke-break areas at work.  Spend time around people who do not smoke.  Lessen stress in your life, because stress can be a smoking trigger for some people. To lessen stress, try: ? Exercising regularly. ? Deep-breathing exercises. ? Yoga. ? Meditating. ? Performing a body scan. This involves closing your eyes, scanning your body from head to toe, and noticing which parts of your body are particularly tense. Purposefully relax the muscles in those areas.  Download or purchase mobile phone or tablet apps (applications) that can help you stick to your quit plan by providing reminders, tips, and encouragement. There are many free apps, such as QuitGuide from the [employer] Office manager for Disease Control and Prevention). You can find other support for quitting smoking (smoking cessation) through smokefree.gov and other websites.  How will I feel when I quit smoking? Within the first 24 hours of quitting smoking, you may start to feel some withdrawal symptoms. These symptoms are usually most noticeable 2-3 days after quitting, but they usually do not last beyond 2-3 weeks. Changes or symptoms that you might experience include:  Mood swings.  Restlessness, anxiety, or irritation.  Difficulty  concentrating.  Dizziness.  Strong cravings for sugary foods in addition to nicotine.  Mild weight gain.  Constipation.  Nausea.  Coughing or a sore throat.  Changes in how your medicines work in your body.  A depressed mood.  Difficulty sleeping (insomnia).  After the first 2-3 weeks of quitting, you may start to notice more positive results, such as:  Improved sense of smell and taste.  Decreased coughing and sore throat.  Slower heart rate.  Lower blood pressure.  Clearer skin.  The ability to breathe more easily.  Fewer sick days.  Quitting smoking is very challenging for most people. Do not get discouraged if you are not successful the first time. Some people need to make many attempts to quit before they achieve long-term success. Do your best to stick to your quit plan, and talk with your health care provider if you have any questions or concerns. This information is not intended to replace advice given to you by your health care provider. Make sure you discuss any questions you have with your health care provider. Document Released: 09/10/2001 Document Revised: 05/14/2016 Document Reviewed: 01/31/2015 Elsevier Interactive Patient Education  [employer].

## 2018-01-13 ENCOUNTER — Encounter: Payer: Self-pay | Admitting: Family Medicine

## 2018-01-13 ENCOUNTER — Encounter: Payer: Self-pay | Admitting: Gastroenterology

## 2018-01-13 ENCOUNTER — Ambulatory Visit (INDEPENDENT_AMBULATORY_CARE_PROVIDER_SITE_OTHER): Payer: Medicare HMO | Admitting: Family Medicine

## 2018-01-13 VITALS — BP 118/78 | HR 91 | Temp 97.8°F | Ht 71.0 in | Wt 205.5 lb

## 2018-01-13 DIAGNOSIS — Z720 Tobacco use: Secondary | ICD-10-CM | POA: Diagnosis not present

## 2018-01-13 DIAGNOSIS — N400 Enlarged prostate without lower urinary tract symptoms: Secondary | ICD-10-CM | POA: Diagnosis not present

## 2018-01-13 DIAGNOSIS — N183 Chronic kidney disease, stage 3 unspecified: Secondary | ICD-10-CM

## 2018-01-13 DIAGNOSIS — I10 Essential (primary) hypertension: Secondary | ICD-10-CM | POA: Diagnosis not present

## 2018-01-13 DIAGNOSIS — R972 Elevated prostate specific antigen [PSA]: Secondary | ICD-10-CM

## 2018-01-13 DIAGNOSIS — E119 Type 2 diabetes mellitus without complications: Secondary | ICD-10-CM | POA: Diagnosis not present

## 2018-01-13 DIAGNOSIS — R195 Other fecal abnormalities: Secondary | ICD-10-CM | POA: Diagnosis not present

## 2018-01-13 DIAGNOSIS — D509 Iron deficiency anemia, unspecified: Secondary | ICD-10-CM

## 2018-01-13 LAB — URINALYSIS, ROUTINE W REFLEX MICROSCOPIC
BILIRUBIN URINE: NEGATIVE
HGB URINE DIPSTICK: NEGATIVE
KETONES UR: NEGATIVE
LEUKOCYTES UA: NEGATIVE
NITRITE: NEGATIVE
Specific Gravity, Urine: 1.015 (ref 1.000–1.030)
TOTAL PROTEIN, URINE-UPE24: NEGATIVE
URINE GLUCOSE: NEGATIVE
UROBILINOGEN UA: 0.2 (ref 0.0–1.0)
pH: 5 (ref 5.0–8.0)

## 2018-01-13 LAB — LIPID PANEL
Cholesterol: 136 mg/dL (ref 0–200)
HDL: 25.1 mg/dL — ABNORMAL LOW (ref >39.00)
LDL Cholesterol: 76 mg/dL (ref 0–99)
NonHDL: 111.29
Total CHOL/HDL Ratio: 5
Triglycerides: 177 mg/dL — ABNORMAL HIGH (ref 0.0–149.0)
VLDL: 35.4 mg/dL (ref 0.0–40.0)

## 2018-01-13 LAB — CBC
HCT: 39.2 % (ref 39.0–52.0)
Hemoglobin: 12.6 g/dL — ABNORMAL LOW (ref 13.0–17.0)
MCHC: 32.2 g/dL (ref 30.0–36.0)
MCV: 80.9 fl (ref 78.0–100.0)
Platelets: 264 10*3/uL (ref 150.0–400.0)
RBC: 4.85 Mil/uL (ref 4.22–5.81)
RDW: 15.1 % (ref 11.5–15.5)
WBC: 7.9 10*3/uL (ref 4.0–10.5)

## 2018-01-13 LAB — COMPREHENSIVE METABOLIC PANEL
ALBUMIN: 4.1 g/dL (ref 3.5–5.2)
ALT: 10 U/L (ref 0–53)
AST: 9 U/L (ref 0–37)
Alkaline Phosphatase: 71 U/L (ref 39–117)
BILIRUBIN TOTAL: 0.4 mg/dL (ref 0.2–1.2)
BUN: 25 mg/dL — ABNORMAL HIGH (ref 6–23)
CHLORIDE: 104 meq/L (ref 96–112)
CO2: 28 meq/L (ref 19–32)
CREATININE: 1.39 mg/dL (ref 0.40–1.50)
Calcium: 9.4 mg/dL (ref 8.4–10.5)
GFR: 64.07 mL/min (ref >60.00)
Glucose, Bld: 111 mg/dL — ABNORMAL HIGH (ref 70–99)
Potassium: 4.3 mEq/L (ref 3.5–5.1)
Sodium: 139 mEq/L (ref 135–145)
Total Protein: 7.5 g/dL (ref 6.0–8.3)

## 2018-01-13 LAB — HEMOGLOBIN A1C: Hgb A1c MFr Bld: 6.4 % (ref 4.6–6.5)

## 2018-01-13 LAB — MICROALBUMIN / CREATININE URINE RATIO
Creatinine,U: 91 mg/dL
Microalb Creat Ratio: 1.7 mg/g (ref 0.0–30.0)
Microalb, Ur: 1.5 mg/dL (ref 0.0–1.9)

## 2018-01-13 LAB — TSH: TSH: 1.94 u[IU]/mL (ref 0.35–4.50)

## 2018-01-13 LAB — PSA: PSA: 4.83 ng/mL — AB (ref 0.10–4.00)

## 2018-01-13 MED ORDER — AMLODIPINE BESYLATE 10 MG PO TABS: 10.0000 mg | ORAL_TABLET | Freq: Every day | ORAL | 1 refills | Status: DC

## 2018-01-13 MED ORDER — SITAGLIP PHOS-METFORMIN HCL ER 50-500 MG PO TB24: 1.0000 | ORAL_TABLET | Freq: Two times a day (BID) | ORAL | 1 refills | Status: AC

## 2018-01-13 MED ORDER — LOSARTAN POTASSIUM-HCTZ 100-12.5 MG PO TABS: 1.0000 | ORAL_TABLET | Freq: Every day | ORAL | 1 refills | Status: DC

## 2018-01-13 MED ORDER — TAMSULOSIN HCL 0.4 MG PO CAPS: 0.4000 mg | ORAL_CAPSULE | ORAL | 1 refills | Status: DC

## 2018-01-13 NOTE — Patient Instructions (Signed)
Chronic Kidney Disease, Adult Chronic kidney disease (CKD) happens when the kidneys are damaged during a time of 3 or more months. The kidneys are two organs that do many important jobs in the body. These jobs include:  Removing wastes and extra fluids from the blood.  Making hormones that maintain the amount of fluid in your tissues and blood vessels.  Making sure that the body has the right amount of fluids and chemicals.  Most of the time, this condition does not go away, but it can usually be controlled. Steps must be taken to slow down the kidney damage or stop it from getting worse. Otherwise, the kidneys may stop working. Follow these instructions at home:  Follow your diet as told by your doctor. You may need to avoid alcohol, salty foods (sodium), and foods that are high in potassium, calcium, and protein.  Take over-the-counter and prescription medicines only as told by your doctor. Do not take any new medicines unless your doctor says you can do that. These include vitamins and minerals. ? Medicines and nutritional supplements can make kidney damage worse. ? Your doctor may need to change how much medicine you take.  Do not use any tobacco products. These include cigarettes, chewing tobacco, and e-cigarettes. If you need help quitting, ask your doctor.  Keep all follow-up visits as told by your doctor. This is important.  Check your blood pressure. Tell your doctor if there are changes to your blood pressure.  Get to a healthy weight. Stay at that weight. If you need help with this, ask your doctor.  Start or continue an exercise plan. Try to exercise at least 30 minutes a day, 5 days a week.  Stay up-to-date with your shots (immunizations) as told by your doctor. Contact a doctor if:  Your symptoms get worse.  You have new symptoms. Get help right away if:  You have symptoms of end-stage kidney disease. These include: ? Headaches. ? Skin that is darker or lighter  than normal. ? Numbness in your hands or feet. ? Easy bruising. ? Having hiccups often. ? Chest pain. ? Shortness of breath. ? Stopping of menstrual periods in women.  You have a fever.  You are making very little pee (urine).  You have pain or bleeding when you pee (urinate). This information is not intended to replace advice given to you by your health care provider. Make sure you discuss any questions you have with your health care provider. Document Released: 12/11/2009 Document Revised: 02/22/2016 Document Reviewed: 05/15/2012 Elsevier Interactive Patient Education  2017 [place] with Quitting Smoking Quitting smoking is a physical and mental challenge. You will face cravings, withdrawal symptoms, and temptation. Before quitting, work with your health care provider to make a plan that can help you cope. Preparation can help you quit and keep you from giving in. How can I cope with cravings? Cravings usually last for 5-10 minutes. If you get through it, the craving will pass. Consider taking the following actions to help you cope with cravings:  Keep your mouth busy: ? Chew sugar-free gum. ? Suck on hard candies or a straw. ? Brush your teeth.  Keep your hands and body busy: ? Immediately change to a different activity when you feel a craving. ? Squeeze or play with a ball. ? Do an activity or a hobby, like making bead jewelry, practicing needlepoint, or working with wood. ? Mix up your normal routine. ? Take a short exercise break. Go for  a quick walk or run up and down stairs. ? Spend time in public places where smoking is not allowed.  Focus on doing something kind or helpful for someone else.  Call a friend or family member to talk during a craving.  Join a support group.  Call a quit line, such as 1-800-QUIT-NOW.  Talk with your health care provider about medicines that might help you cope with cravings and make quitting easier for you.  How can I  deal with withdrawal symptoms? Your body may experience negative effects as it tries to get used to not having nicotine in the system. These effects are called withdrawal symptoms. They may include:  Feeling hungrier than normal.  Trouble concentrating.  Irritability.  Trouble sleeping.  Feeling depressed.  Restlessness and agitation.  Craving a cigarette.  To manage withdrawal symptoms:  Avoid places, people, and activities that trigger your cravings.  Remember why you want to quit.  Get plenty of sleep.  Avoid coffee and other caffeinated drinks. These may worsen some of your symptoms.  How can I handle social situations? Social situations can be difficult when you are quitting smoking, especially in the first few weeks. To manage this, you can:  Avoid parties, bars, and other social situations where people might be smoking.  Avoid alcohol.  Leave right away if you have the urge to smoke.  Explain to your family and friends that you are quitting smoking. Ask for understanding and support.  Plan activities with friends or family where smoking is not an option.  What are some ways I can cope with stress? Wanting to smoke may cause stress, and stress can make you want to smoke. Find ways to manage your stress. Relaxation techniques can help. For example:  Breathe slowly and deeply, in through your nose and out through your mouth.  Listen to soothing, relaxing music.  Talk with a family member or friend about your stress.  Light a candle.  Soak in a bath or take a shower.  Think about a peaceful place.  What are some ways I can prevent weight gain? Be aware that many people gain weight after they quit smoking. However, not everyone does. To keep from gaining weight, have a plan in place before you quit and stick to the plan after you quit. Your plan should include:  Having healthy snacks. When you have a craving, it may help to: ? Eat plain popcorn, crunchy  carrots, celery, or other cut vegetables. ? Chew sugar-free gum.  Changing how you eat: ? Eat small portion sizes at meals. ? Eat 4-6 small meals throughout the day instead of 1-2 large meals a day. ? Be mindful when you eat. Do not watch television or do other things that might distract you as you eat.  Exercising regularly: ? Make time to exercise each day. If you do not have time for a long workout, do short bouts of exercise for 5-10 minutes several times a day. ? Do some form of strengthening exercise, like weight lifting, and some form of aerobic exercise, like running or swimming.  Drinking plenty of water or other low-calorie or no-calorie drinks. Drink 6-8 glasses of water daily, or as much as instructed by your health care provider.  Summary  Quitting smoking is a physical and mental challenge. You will face cravings, withdrawal symptoms, and temptation to smoke again. Preparation can help you as you go through these challenges.  You can cope with cravings by keeping your mouth busy (  such as by chewing gum), keeping your body and hands busy, and making calls to family, friends, or a helpline for people who want to quit smoking.  You can cope with withdrawal symptoms by avoiding places where people smoke, avoiding drinks with caffeine, and getting plenty of rest.  Ask your health care provider about the different ways to prevent weight gain, avoid stress, and handle social situations. This information is not intended to replace advice given to you by your health care provider. Make sure you discuss any questions you have with your health care provider. Document Released: 09/13/2016 Document Revised: 09/13/2016 Document Reviewed: 09/13/2016 Elsevier Interactive Patient Education  2018 [employer].  Diabetes Mellitus and Exercise Exercising regularly is important for your overall health, especially when you have diabetes (diabetes mellitus). Exercising is not only about losing  weight. It has many health benefits, such as increasing muscle strength and bone density and reducing body fat and stress. This leads to improved fitness, flexibility, and endurance, all of which result in better overall health. Exercise has additional benefits for people with diabetes, including:  Reducing appetite.  Helping to lower and control blood glucose.  Lowering blood pressure.  Helping to control amounts of fatty substances (lipids) in the blood, such as cholesterol and triglycerides.  Helping the body to respond better to insulin (improving insulin sensitivity).  Reducing how much insulin the body needs.  Decreasing the risk for heart disease by: ? Lowering cholesterol and triglyceride levels. ? Increasing the levels of good cholesterol. ? Lowering blood glucose levels.  What is my activity plan? Your health care provider or certified diabetes educator can help you make a plan for the type and frequency of exercise (activity plan) that works for you. Make sure that you:  Do at least 150 minutes of moderate-intensity or vigorous-intensity exercise each week. This could be brisk walking, biking, or water aerobics. ? Do stretching and strength exercises, such as yoga or weightlifting, at least 2 times a week. ? Spread out your activity over at least 3 days of the week.  Get some form of physical activity every day. ? Do not go more than 2 days in a row without some kind of physical activity. ? Avoid being inactive for more than 90 minutes at a time. Take frequent breaks to walk or stretch.  Choose a type of exercise or activity that you enjoy, and set realistic goals.  Start slowly, and gradually increase the intensity of your exercise over time.  What do I need to know about managing my diabetes?  Check your blood glucose before and after exercising. ? If your blood glucose is higher than 240 mg/dL (13.3 mmol/L) before you exercise, check your urine for ketones. If you  have ketones in your urine, do not exercise until your blood glucose returns to normal.  Know the symptoms of low blood glucose (hypoglycemia) and how to treat it. Your risk for hypoglycemia increases during and after exercise. Common symptoms of hypoglycemia can include: ? Hunger. ? Anxiety. ? Sweating and feeling clammy. ? Confusion. ? Dizziness or feeling light-headed. ? Increased heart rate or palpitations. ? Blurry vision. ? Tingling or numbness around the mouth, lips, or tongue. ? Tremors or shakes. ? Irritability.  Keep a rapid-acting carbohydrate snack available before, during, and after exercise to help prevent or treat hypoglycemia.  Avoid injecting insulin into areas of the body that are going to be exercised. For example, avoid injecting insulin into: ? The arms, when playing tennis. ?  The legs, when jogging.  Keep records of your exercise habits. Doing this can help you and your health care provider adjust your diabetes management plan as needed. Write down: ? Food that you eat before and after you exercise. ? Blood glucose levels before and after you exercise. ? The type and amount of exercise you have done. ? When your insulin is expected to peak, if you use insulin. Avoid exercising at times when your insulin is peaking.  When you start a new exercise or activity, work with your health care provider to make sure the activity is safe for you, and to adjust your insulin, medicines, or food intake as needed.  Drink plenty of water while you exercise to prevent dehydration or heat stroke. Drink enough fluid to keep your urine clear or pale yellow. This information is not intended to replace advice given to you by your health care provider. Make sure you discuss any questions you have with your health care provider. Document Released: 12/07/2003 Document Revised: 04/05/2016 Document Reviewed: 02/26/2016 Elsevier Interactive Patient Education  2018 [employer].  Type 2  Diabetes Mellitus, Diagnosis, Adult Type 2 diabetes (type 2 diabetes mellitus) is a long-term (chronic) disease. It may be caused by one or both of these problems:  Your body does not make enough of a hormone called insulin.  Your body does not react in a normal way to insulin that it makes.  Insulin lets sugars (glucose) go into cells in the body. This gives you energy. If you have type 2 diabetes, sugars cannot get into cells. This causes high blood sugar (hyperglycemia). Your doctor will set treatment goals for you. Generally, you should have these blood sugar levels:  Before meals (preprandial): 80-130 mg/dL (4.4-7.2 mmol/L).  After meals (postprandial): below 180 mg/dL (10 mmol/L).  A1c (hemoglobin A1c) level: less than 7%.  Follow these instructions at home: Questions to Ask Your Doctor  You may want to ask these questions:  Do I need to meet with a diabetes educator?  Where can I find a support group for people with diabetes?  What equipment will I need to care for myself at home?  What diabetes medicines do I need? When should I take them?  How often do I need to check my blood sugar?  What number can I call if I have questions?  When is my next doctor's visit?  General instructions  Take over-the-counter and prescription medicines only as told by your doctor.  Keep all follow-up visits as told by your doctor. This is important. Contact a doctor if:  Your blood sugar is at or above 240 mg/dL (13.3 mmol/L) for 2 days in a row.  You have been sick or have had a fever for 2 days or more and you are not getting better.  You have any of these problems for more than 6 hours: ? You cannot eat or drink. ? You feel sick to your stomach (nauseous). ? You throw up (vomit). ? You have watery poop (diarrhea). Get help right away if:  Your blood sugar is lower than 54 mg/dL (3 mmol/L).  You get confused.  You have trouble: ? Thinking clearly. ? Breathing.  You  have moderate or large ketone levels in your pee (urine). This information is not intended to replace advice given to you by your health care provider. Make sure you discuss any questions you have with your health care provider. Document Released: 06/25/2008 Document Revised: 02/22/2016 Document Reviewed: 10/20/2015 Elsevier Interactive  Patient Education  [employer].

## 2018-01-13 NOTE — Progress Notes (Addendum)
Subjective:  Patient ID: Raymond Pope, male    DOB: 20-Dec-1940  Age: 77 y.o. MRN: [id number]  CC: Follow-up   HPI Raymond Pope presents for a physical and for follow-up of multiple medical issues.  His blood pressure is been well controlled on his current regimen.  He tells me the diabetes has been controlled his well but I did not see a recent hemoglobin A1c in the medical record.  He has BPH symptoms that are controlled when he takes his Flomax.  He is also seeing the urologist for complex renal cysts.  He tells me he has follow-up with them next month.  Medical review of the labs shows a microcytic anemia diagnosed in May of last year.  He tells me that his insurance company obtained Hemoccults on him that were positive.  They advised him to see me about that.  He denies frank blood in his in his stool.  He says that his bowel habits have changed with the sensation of incomplete emptying.  Medical review of his chart also revealed a GFR of 43 back in August of last year.  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  He gets no regular exercise.  He continues to smoke a pipe and is not interested in stopping at this time.  His [birth year] is tomorrow.  Outpatient Medications Prior to Visit  Medication Sig Dispense Refill  . amLODipine (NORVASC) 10 MG tablet Take 10 mg by mouth daily.     Marland Kitchen aspirin EC 81 MG tablet Take 81 mg by mouth daily.    Marland Kitchen losartan-hydrochlorothiazide (HYZAAR) 100-12.5 MG tablet Take 1 tablet by mouth daily.     . SitaGLIPtin-MetFORMIN HCl (JANUMET XR) 50-500 MG TB24 Take 1 tablet by mouth 2 (two) times daily.     . Tamsulosin HCl (FLOMAX) 0.4 MG CAPS Take 0.4 mg by mouth 1 dose over 24 hours.       No facility-administered medications prior to visit.     ROS Review of Systems  Constitutional: Negative for diaphoresis, fever and unexpected weight change.  HENT: Negative.   Eyes: Negative for photophobia and visual  disturbance.  Respiratory: Negative for cough, wheezing and stridor.   Cardiovascular: Negative.   Gastrointestinal: Negative for abdominal pain, anal bleeding and blood in stool.  Endocrine: Negative for polyphagia and polyuria.  Genitourinary: Negative for flank pain, hematuria and urgency.  Musculoskeletal: Negative for gait problem and myalgias.  Skin: Negative for pallor and rash.  Allergic/Immunologic: Negative for immunocompromised state.  Neurological: Negative for headaches.  Hematological: Does not bruise/bleed easily.  Psychiatric/Behavioral: Negative.     Objective:  BP 118/78 (BP Location: Left Arm, Patient Position: Sitting, Cuff Size: Normal)   Pulse 91   Temp 97.8 F (36.6 C) (Oral)   Ht 5\' 11"  (1.803 m)   Wt 205 lb 8 oz (93.2 kg)   SpO2 97%   BMI 28.66 kg/m   BP Readings from Last 3 Encounters:  01/13/18 118/78  12/15/17 122/80  05/18/17 122/83    Wt Readings from Last 3 Encounters:  01/13/18 205 lb 8 oz (93.2 kg)  12/15/17 210 lb 4 oz (95.4 kg)  10/16/17 216 lb 11.2 oz (98.3 kg)    Physical Exam  Constitutional: He appears well-developed and well-nourished. No distress.  HENT:  Head: Normocephalic and atraumatic.  Right Ear: External ear normal.  Nose: Nose normal.  Mouth/Throat: Oropharynx is clear and moist. No oropharyngeal exudate.  Eyes: Pupils are equal, round, and reactive to light. Conjunctivae and EOM are normal. Right eye exhibits no discharge. Left eye exhibits no discharge. No scleral icterus.  Neck: Normal range of motion. Neck supple. No JVD present. No tracheal deviation present. No thyromegaly present.  Cardiovascular: Normal rate, regular rhythm and normal heart sounds.  Pulmonary/Chest: He is in respiratory distress.  Abdominal: Soft. Bowel sounds are normal. He exhibits distension. He exhibits no mass. There is no tenderness. There is no guarding. A hernia is present. Hernia confirmed positive in the ventral area.  Genitourinary:  Rectum normal. Rectal exam shows no external hemorrhoid, no internal hemorrhoid, no fissure, no mass, no tenderness, anal tone normal and guaiac negative stool. Prostate is enlarged. Prostate is not tender.  Musculoskeletal: He exhibits no edema or tenderness.  Lymphadenopathy:    He has no cervical adenopathy.  Neurological: He is alert.  Skin: No rash noted. He is not diaphoretic. No erythema. No pallor.    Lab Results  Component Value Date   WBC 7.9 01/13/2018   HGB 12.6 (L) 01/13/2018   HCT 39.2 01/13/2018   PLT 264.0 01/13/2018   GLUCOSE 111 (H) 01/13/2018   CHOL 136 01/13/2018   TRIG 177.0 (H) 01/13/2018   HDL 25.10 (L) 01/13/2018   LDLCALC 76 01/13/2018   ALT 10 01/13/2018   AST 9 01/13/2018   NA 139 01/13/2018   K 4.3 01/13/2018   CL 104 01/13/2018   CREATININE 1.39 01/13/2018   BUN 25 (H) 01/13/2018   CO2 28 01/13/2018   TSH 1.94 01/13/2018   PSA 4.83 (H) 01/13/2018   HGBA1C 6.4 01/13/2018   MICROALBUR 1.5 01/13/2018    Dg Abd 2 Views  Result Date: 05/18/2017 CLINICAL DATA:  Constipation and hemorrhoids. EXAM: ABDOMEN - 2 VIEW COMPARISON:  None. FINDINGS: Mild atelectasis in the left base. No free air, portal venous gas, or pneumatosis. Mild fecal loading in the colon. No bowel obstruction. No renal stones. Calcifications in the right pelvis are likely phleboliths. No definitive ureteral stones. IMPRESSION: Mild fecal loading in the colon. Electronically Signed   By: Dorise Bullion III M.D   On: 05/18/2017 13:43    Assessment & Plan:   [name] was seen today for follow-up.  Diagnoses and all orders for this visit:  Essential hypertension -     amLODipine (NORVASC) 10 MG tablet; Take 1 tablet (10 mg total) by mouth daily. -     losartan-hydrochlorothiazide (HYZAAR) 100-12.5 MG tablet; Take 1 tablet by mouth daily. -     CBC -     Comprehensive metabolic panel -     Lipid panel -     Urinalysis, Routine w reflex microscopic  Controlled type 2 diabetes  mellitus without complication, without long-term current use of insulin (HCC) -     losartan-hydrochlorothiazide (HYZAAR) 100-12.5 MG tablet; Take 1 tablet by mouth daily. -     SitaGLIPtin-MetFORMIN HCl (JANUMET XR) 50-500 MG TB24; Take 1 tablet by mouth 2 (two) times daily. -     CBC -     Comprehensive metabolic panel -     Lipid panel -     Hemoglobin A1c -     Urinalysis, Routine w reflex microscopic -     Microalbumin / creatinine urine ratio  Tobacco use  Benign prostatic hyperplasia, unspecified whether lower urinary tract symptoms present -     tamsulosin (FLOMAX) 0.4 MG CAPS capsule; Take 1 capsule (0.4 mg total) by mouth 1 day or 1  dose. -     PSA  Heme positive stool -     Ambulatory referral to Gastroenterology  Microcytic anemia -     TSH -     Iron, TIBC and Ferritin Panel -     Retic -     Ambulatory referral to Gastroenterology -     ferrous sulfate 325 (65 FE) MG tablet; Take 1 tablet (325 mg total) by mouth daily with breakfast.  CKD (chronic kidney disease) stage 3, GFR 30-59 ml/min (HCC) -     Lipid panel -     TSH  BPH with elevated PSA   I have discontinued Antinio G. Apfel's aspirin EC. I have also changed his amLODipine and tamsulosin. Additionally, I am having him start on ferrous sulfate. Lastly, I am having him maintain his losartan-hydrochlorothiazide and SitaGLIPtin-MetFORMIN HCl.  Meds ordered this encounter  Medications  . amLODipine (NORVASC) 10 MG tablet    Sig: Take 1 tablet (10 mg total) by mouth daily.    Dispense:  100 tablet    Refill:  1  . losartan-hydrochlorothiazide (HYZAAR) 100-12.5 MG tablet    Sig: Take 1 tablet by mouth daily.    Dispense:  100 tablet    Refill:  1  . SitaGLIPtin-MetFORMIN HCl (JANUMET XR) 50-500 MG TB24    Sig: Take 1 tablet by mouth 2 (two) times daily.    Dispense:  180 tablet    Refill:  1  . tamsulosin (FLOMAX) 0.4 MG CAPS capsule    Sig: Take 1 capsule (0.4 mg total) by mouth 1 day or 1 dose.     Dispense:  100 capsule    Refill:  1  . ferrous sulfate 325 (65 FE) MG tablet    Sig: Take 1 tablet (325 mg total) by mouth daily with breakfast.    Dispense:  90 tablet    Refill:  1   Patient again advised to stop smoking.  Gave him information on increasing his level of activity and how that would help his diabetes and hypertension and possibly his stooling patterns.  His stool today was heme negative but he has documented microcytic anemia with changes in his bowel habits.  He has been referred for colonoscopy.  Urine will be checked today he is already seeing urology for complex renal cysts.  Follow-up: Return in about 3 months (around 04/14/2018).  Libby Maw, MD

## 2018-01-14 ENCOUNTER — Telehealth: Payer: Self-pay | Admitting: Family Medicine

## 2018-01-14 LAB — IRON,TIBC AND FERRITIN PANEL
%SAT: 10 % (calc) — ABNORMAL LOW (ref 15–60)
FERRITIN: 154 ng/mL (ref 20–380)
IRON: 29 ug/dL — AB (ref 50–180)
TIBC: 287 mcg/dL (calc) (ref 250–425)

## 2018-01-14 LAB — RETICULOCYTES
ABS RETIC: 68180 {cells}/uL (ref 25000–9000)
RETIC CT PCT: 1.4 %

## 2018-01-14 MED ORDER — FERROUS SULFATE 325 (65 FE) MG PO TABS: 325.0000 mg | ORAL_TABLET | Freq: Every day | ORAL | 1 refills | Status: DC

## 2018-01-14 NOTE — Telephone Encounter (Signed)
Copied from [phone] [phone] [phone]. Topic: Quick Communication - Lab Results >> Jan 14, 2018  2:22 PM Self, Rande Brunt, CMA wrote: Called patient to inform them of his lab results. When patient returns call, triage nurse may disclose results.

## 2018-01-14 NOTE — Addendum Note (Signed)
Addended by: Abelino Derrick A on: 01/14/2018 09:30 AM   Modules accepted: Orders

## 2018-01-15 ENCOUNTER — Ambulatory Visit: Payer: Medicare HMO | Admitting: Skilled Nursing Facility1

## 2018-02-03 ENCOUNTER — Ambulatory Visit (HOSPITAL_COMMUNITY)
Admission: RE | Admit: 2018-02-03 | Discharge: 2018-02-03 | Disposition: A | Discharge status: Home / Self Care | Payer: Medicare HMO | Source: Ambulatory Visit | Attending: Urology | Admitting: Urology

## 2018-02-03 DIAGNOSIS — J90 Pleural effusion, not elsewhere classified: Secondary | ICD-10-CM | POA: Diagnosis not present

## 2018-02-03 DIAGNOSIS — I700 Atherosclerosis of aorta: Secondary | ICD-10-CM | POA: Diagnosis not present

## 2018-02-03 DIAGNOSIS — N281 Cyst of kidney, acquired: Secondary | ICD-10-CM | POA: Diagnosis present

## 2018-02-03 DIAGNOSIS — K760 Fatty (change of) liver, not elsewhere classified: Secondary | ICD-10-CM | POA: Insufficient documentation

## 2018-02-03 MED ORDER — GADOBENATE DIMEGLUMINE 529 MG/ML IV SOLN
20.0000 mL | Freq: Once | INTRAVENOUS | Status: AC | PRN
  Administered 2018-02-03: 19 mL via INTRAVENOUS

## 2018-03-02 ENCOUNTER — Ambulatory Visit: Payer: Medicare HMO | Admitting: Gastroenterology

## 2018-03-02 ENCOUNTER — Encounter: Payer: Self-pay | Admitting: Gastroenterology

## 2018-03-02 ENCOUNTER — Encounter (INDEPENDENT_AMBULATORY_CARE_PROVIDER_SITE_OTHER): Payer: Self-pay

## 2018-03-02 VITALS — BP 124/70 | HR 92 | Ht 70.0 in | Wt 210.0 lb

## 2018-03-02 DIAGNOSIS — D509 Iron deficiency anemia, unspecified: Secondary | ICD-10-CM | POA: Diagnosis not present

## 2018-03-02 MED ORDER — SUPREP BOWEL PREP KIT 17.5-3.13-1.6 GM/177ML PO SOLN: ORAL | 0 refills | Status: DC

## 2018-03-02 NOTE — Patient Instructions (Addendum)
If you are age 75 or older, your body mass index should be between 23-30. Your Body mass index is 30.13 kg/m. If this is out of the aforementioned range listed, please consider follow up with your Primary Care Provider.  If you are age 50 or younger, your body mass index should be between 19-25. Your Body mass index is 30.13 kg/m. If this is out of the aformentioned range listed, please consider follow up with your Primary Care Provider.   You have been scheduled for an endoscopy and colonoscopy. Please follow the written instructions given to you at your visit today. Please pick up your prep supplies at the pharmacy within the next 1-3 days. If you use inhalers (even only as needed), please bring them with you on the day of your procedure. Your physician has requested that you go to www.startemmi.com and enter the access code given to you at your visit today. This web site gives a general overview about your procedure. However, you should still follow specific instructions given to you by our office regarding your preparation for the procedure.  Thank you for entrusting me with your care and for choosing [facility], Dr. [place] Cellar

## 2018-03-02 NOTE — Progress Notes (Signed)
HPI :  77 y/o male with a history of DM, HTN, PUD, and benign renal cyst referred here for evaluation of iron deficiency anemia referred by Abelino Derrick.  Labs on April 16th show the following: Hgb 12.6, MCV 80.9, plt 267, WBC 7.9 Iron 26 (low), iron sat 10%, ferritin 154  Prior CBC in 04/2017 showed Hgb of 11.9 with MCV of 77, unclear of labs prior to then.  He endorses a history of prior peptic ulcer diagnosed in the 1980s. He states he has not had any follow-up for that particular issue. He denies any problems with reflux or post prandial upper abdominal pain. He denies any dysphagia. He does have some appetite loss which comes and goes without clear cause. He denies any blood in his stools. He is been on oral iron since diagnosis of iron deficiency and his stools have been dark on that regimen. He has roughly one bowel movement per day which is fairly regular. He denies any lower abdominal pains. No weight loss. He doesn't take any blood thinners. He denies any NSAID use. He denies any family history of colon cancer. He had a colonoscopy at very long time ago which he thinks was normal, no follow-up colonoscopy is since that time.    Past Medical History:  Diagnosis Date  . Anemia   . Diabetes mellitus without complication ([place])   . HTN (hypertension)   . Peptic ulcer   . Renal cyst      Past Surgical History:  Procedure Laterality Date  . NO PAST SURGERIES     Family History  Problem Relation Age of Onset  . Breast cancer Daughter   . Diabetes Son   . Heart murmur Son    Social History   Tobacco Use  . Smoking status: Current Every Day Smoker    Types: Pipe  . Smokeless tobacco: Never Used  Substance Use Topics  . Alcohol use: No  . Drug use: No   Current Outpatient Medications  Medication Sig Dispense Refill  . amLODipine (NORVASC) 10 MG tablet Take 1 tablet (10 mg total) by mouth daily. 100 tablet 1  . aspirin EC 81 MG tablet Take 81 mg by mouth daily.    .  ferrous sulfate 325 (65 FE) MG tablet Take 1 tablet (325 mg total) by mouth daily with breakfast. 90 tablet 1  . losartan-hydrochlorothiazide (HYZAAR) 100-12.5 MG tablet Take 1 tablet by mouth daily. 100 tablet 1  . SitaGLIPtin-MetFORMIN HCl (JANUMET XR) 50-500 MG TB24 Take 1 tablet by mouth 2 (two) times daily. 180 tablet 1  . tamsulosin (FLOMAX) 0.4 MG CAPS capsule Take 1 capsule (0.4 mg total) by mouth 1 day or 1 dose. 100 capsule 1   No current facility-administered medications for this visit.    No Known Allergies   Review of Systems: All systems reviewed and negative except where noted in HPI.    Mr Abdomen Wwo Contrast  Result Date: 02/03/2018 CLINICAL DATA:  77 year old male with history of complex renal cyst. Follow-up study. EXAM: MRI ABDOMEN WITHOUT AND WITH CONTRAST TECHNIQUE: Multiplanar multisequence MR imaging of the abdomen was performed both before and after the administration of intravenous contrast. CONTRAST:  85mL MULTIHANCE GADOBENATE DIMEGLUMINE 529 MG/ML IV SOLN COMPARISON:  MRI of the abdomen 04/29/2017. FINDINGS: Lower chest: Trace right pleural effusion lying dependently. Hepatobiliary: Diffuse loss of signal intensity throughout the hepatic parenchyma, compatible with severe hepatic steatosis. Multiple T1 hypointense, T2 hyperintense, nonenhancing lesions are noted throughout the  liver, compatible with cysts and/or biliary hamartomas. The largest simple cyst is in segment 2 of the liver measuring 18 x 17 mm (axial image 7 of series 4). No other suspicious appearing hepatic lesions are noted. No intra or extrahepatic biliary ductal dilatation. Gallbladder is normal in appearance. Pancreas: No pancreatic mass. No pancreatic ductal dilatation. No pancreatic or peripancreatic fluid or inflammatory changes. Spleen:  Unremarkable. Adrenals/Urinary Tract: Multiple renal lesions of varying degrees of complexity are again noted, appearing similar in size, number and  distribution to the prior study. The most concerning of these lesions is in the posterior aspect of the upper pole of the right kidney (axial image 27 of series 4) measuring 2.7 cm in diameter, demonstrating predominantly T1 hypointensity and T2 hyperintensity, with some posterior T2 hypointensity which is mildly T1 hyperintense, and appears to demonstrates some slight enhancement on post gadolinium imaging (Bosniak class 3). Overall, this lesion is unchanged compared to the prior examination. Also in the posterior aspect of the lower pole the right kidney (axial image 61 of series 1001) there is an exophytic 13 mm lesion that is low to intermediate T1 signal intensity, isodose slightly hypointense on T2 weighted images, and demonstrates some mild enhancement. This lesion defies typical characterization by imaging, but appears roughly stable in appearance dating back to 2016, suggesting a benign lesion, potentially granulation tissue at site of prior involuted cystic lesion. In the upper pole of the left kidney there is a 1.6 cm lesion (axial image 18 of series 4) which is centrally predominantly T1 hyperintense and T2 hyperintense, with some dependent T2 hypointensity, multiple thin internal septations, but no definitive internal enhancement on post gadolinium imaging, most compatible with a mild proteinaceous/hemorrhagic cyst (Bosniak class 66F). Exophytic T1 hyperintense, T2 isointense, nonenhancing lesion extending from the posterior aspect of the interpolar region of the left kidney measuring 2.2 cm in diameter (axial image 54 of series 1000) is stable, compatible with a mildly proteinaceous/hemorrhagic cyst (Bosniak class 2). Multiple other simple cysts (Bosniak class 1) and hemorrhagic cysts (typically tiny subcentimeter in size) are also noted. No enlarging clearly aggressive appearing lesion is otherwise noted. No hydroureteronephrosis in the visualized portions of the abdomen. Bilateral adrenal glands are  normal in appearance. Stomach/Bowel: Visualized portions are unremarkable. Vascular/Lymphatic: Atherosclerosis of the abdominal aorta, without evidence of aneurysm in the abdominal vasculature. Other: No significant volume of ascites noted in the visualized portions of the peritoneal cavity. Musculoskeletal: No aggressive appearing osseous lesions are noted in the visualized portions of the skeleton. IMPRESSION: 1. Overall, there is no significant change in multiple renal lesions, as detailed above, including Bosniak class 1, class 2, class 66F and class 3 cysts. 2. Hepatic steatosis. 3. Aortic atherosclerosis. 4. Trace right pleural effusion lying dependently. 5. Additional incidental findings, as above. Electronically Signed   By: Vinnie Langton M.D.   On: 02/03/2018 14:55    Lab Results  Component Value Date   WBC 7.9 01/13/2018   HGB 12.6 (L) 01/13/2018   HCT 39.2 01/13/2018   MCV 80.9 01/13/2018   PLT 264.0 01/13/2018    Lab Results  Component Value Date   IRON 29 (L) 01/13/2018   TIBC 287 01/13/2018   FERRITIN 154 01/13/2018     Physical Exam: BP 124/70 (BP Location: Left Arm, Patient Position: Sitting, Cuff Size: Normal)   Pulse 92   Ht 5\' 10"  (1.778 m) Comment: height measured without shoes  Wt 210 lb (95.3 kg)   BMI 30.13 kg/m  Constitutional: Pleasant,well-developed,  male in no acute distress. HEENT: Normocephalic and atraumatic. Conjunctivae are normal. No scleral icterus. Neck supple.  Cardiovascular: Normal rate, regular rhythm.  Pulmonary/chest: Effort normal and breath sounds normal. No wheezing, rales or rhonchi. Abdominal: Soft, nondistended, nontender. There are no masses palpable. No hepatomegaly. Extremities: no edema Lymphadenopathy: No cervical adenopathy noted. Neurological: Alert and oriented to person place and time. Skin: Skin is warm and dry. No rashes noted. Psychiatric: Normal mood and affect. Behavior is normal.   ASSESSMENT AND  PLAN: 77 year old male with medical history as outlined above, presenting with iron deficiency anemia. Hemoglobin ranging from 11-12 since 2018 that I can see, no prior labs available for comparison.  I discussed differential for iron deficiency anemia with him. Given this finding I'm recommending an upper endoscopy and a colonoscopy. I discussed risks and benefits of endoscopy and anesthesia with him and he wanted to proceed. Hopefully these exams with clarify the cause for his anemia, if not and anemia persists may consider capsule endoscopy to clear the small bowel. He will continue ferrous sulfate in the interim. He does have a history of peptic ulcer disease remotely however he is not using NSAIDs and given he is asymptomatic we'll hold off on empiric PPI this time. He agreed with the plan and will await results of the endoscopies initially. He agreed with the plan.  Loomis Cellar, MD [facility]  CC: Libby Maw,*

## 2018-03-06 ENCOUNTER — Telehealth: Payer: Self-pay | Admitting: Gastroenterology

## 2018-03-06 NOTE — Telephone Encounter (Signed)
Sample bowel prep has been provided, pt notified and aware.

## 2018-03-10 ENCOUNTER — Ambulatory Visit (INDEPENDENT_AMBULATORY_CARE_PROVIDER_SITE_OTHER): Payer: Medicare HMO | Admitting: Family Medicine

## 2018-03-10 ENCOUNTER — Encounter: Payer: Self-pay | Admitting: Family Medicine

## 2018-03-10 VITALS — BP 118/72 | HR 105 | Ht 70.0 in | Wt 208.5 lb

## 2018-03-10 DIAGNOSIS — E611 Iron deficiency: Secondary | ICD-10-CM | POA: Diagnosis not present

## 2018-03-10 DIAGNOSIS — Z720 Tobacco use: Secondary | ICD-10-CM

## 2018-03-10 DIAGNOSIS — D509 Iron deficiency anemia, unspecified: Secondary | ICD-10-CM

## 2018-03-10 DIAGNOSIS — R1319 Other dysphagia: Secondary | ICD-10-CM | POA: Insufficient documentation

## 2018-03-10 DIAGNOSIS — R1310 Dysphagia, unspecified: Secondary | ICD-10-CM | POA: Diagnosis not present

## 2018-03-10 NOTE — Progress Notes (Signed)
Subjective:  Patient ID: Raymond Pope, male    DOB: 04/17/1941  Age: 77 y.o. MRN: [id number]  CC: Annual Exam   HPI ARISTOTLE LIEB presents for follow-up of his iron deficiency anemia, hypertension, elevated cholesterol, diabetes.  His blood pressure is well controlled on his current regimen.  Recent hemoglobin A1c was 6.4 on the Janumet.  He is having no problems with his medicines.  He has been taking the iron pill as prescribed.  He is scheduled for colonoscopy on August 7.  He mentions today that he has had problems with dysphagia concerning liquids.  He says that solids are not an issue.  He does have a distant history of reflux but has not had to take anything for that in some years.  He did see urology for his BPH symptoms with a slightly elevated PSA.  He said that he discussed possible work-up and he elected to decline that at this time.  He did see the same doctor who he has been seen for his renal cysts.  He tells me that he was told his renal cysts are stable  Outpatient Medications Prior to Visit  Medication Sig Dispense Refill  . amLODipine (NORVASC) 10 MG tablet Take 1 tablet (10 mg total) by mouth daily. 100 tablet 1  . aspirin EC 81 MG tablet Take 81 mg by mouth daily.    . ferrous sulfate 325 (65 FE) MG tablet Take 1 tablet (325 mg total) by mouth daily with breakfast. 90 tablet 1  . losartan-hydrochlorothiazide (HYZAAR) 100-12.5 MG tablet Take 1 tablet by mouth daily. 100 tablet 1  . SitaGLIPtin-MetFORMIN HCl (JANUMET XR) 50-500 MG TB24 Take 1 tablet by mouth 2 (two) times daily. 180 tablet 1  . SUPREP BOWEL PREP KIT 17.5-3.13-1.6 GM/177ML SOLN Suprep-Use as directed 354 mL 0  . tamsulosin (FLOMAX) 0.4 MG CAPS capsule Take 1 capsule (0.4 mg total) by mouth 1 day or 1 dose. 100 capsule 1   No facility-administered medications prior to visit.     ROS Review of Systems  Objective:  BP 118/72   Pulse (!) 105   Ht _0  (1.778 m)   Wt 208 lb 8 oz (94.6 kg)   SpO2  97%   BMI 29.92 kg/m   BP Readings from Last 3 Encounters:  03/10/18 118/72  03/02/18 124/70  01/13/18 118/78    Wt Readings from Last 3 Encounters:  03/10/18 208 lb 8 oz (94.6 kg)  03/02/18 210 lb (95.3 kg)  01/13/18 205 lb 8 oz (93.2 kg)    Physical Exam  Lab Results  Component Value Date   WBC 7.9 01/13/2018   HGB 12.6 (L) 01/13/2018   HCT 39.2 01/13/2018   PLT 264.0 01/13/2018   GLUCOSE 111 (H) 01/13/2018   CHOL 136 01/13/2018   TRIG 177.0 (H) 01/13/2018   HDL 25.10 (L) 01/13/2018   LDLCALC 76 01/13/2018   ALT 10 01/13/2018   AST 9 01/13/2018   NA 139 01/13/2018   K 4.3 01/13/2018   CL 104 01/13/2018   CREATININE 1.39 01/13/2018   BUN 25 (H) 01/13/2018   CO2 28 01/13/2018   TSH 1.94 01/13/2018   PSA 4.83 (H) 01/13/2018   HGBA1C 6.4 01/13/2018   MICROALBUR 1.5 01/13/2018    Mr Abdomen Wwo Contrast  Result Date: 02/03/2018 CLINICAL DATA:  77 year old male with history of complex renal cyst. Follow-up study. EXAM: MRI ABDOMEN WITHOUT AND WITH CONTRAST TECHNIQUE: Multiplanar multisequence MR imaging of the  abdomen was performed both before and after the administration of intravenous contrast. CONTRAST:  54m MULTIHANCE GADOBENATE DIMEGLUMINE 529 MG/ML IV SOLN COMPARISON:  MRI of the abdomen 04/29/2017. FINDINGS: Lower chest: Trace right pleural effusion lying dependently. Hepatobiliary: Diffuse loss of signal intensity throughout the hepatic parenchyma, compatible with severe hepatic steatosis. Multiple T1 hypointense, T2 hyperintense, nonenhancing lesions are noted throughout the liver, compatible with cysts and/or biliary hamartomas. The largest simple cyst is in segment 2 of the liver measuring 18 x 17 mm (axial image 7 of series 4). No other suspicious appearing hepatic lesions are noted. No intra or extrahepatic biliary ductal dilatation. Gallbladder is normal in appearance. Pancreas: No pancreatic mass. No pancreatic ductal dilatation. No pancreatic or  peripancreatic fluid or inflammatory changes. Spleen:  Unremarkable. Adrenals/Urinary Tract: Multiple renal lesions of varying degrees of complexity are again noted, appearing similar in size, number and distribution to the prior study. The most concerning of these lesions is in the posterior aspect of the upper pole of the right kidney (axial image 27 of series 4) measuring 2.7 cm in diameter, demonstrating predominantly T1 hypointensity and T2 hyperintensity, with some posterior T2 hypointensity which is mildly T1 hyperintense, and appears to demonstrates some slight enhancement on post gadolinium imaging (Bosniak class 3). Overall, this lesion is unchanged compared to the prior examination. Also in the posterior aspect of the lower pole the right kidney (axial image 61 of series 1001) there is an exophytic 13 mm lesion that is low to intermediate T1 signal intensity, isodose slightly hypointense on T2 weighted images, and demonstrates some mild enhancement. This lesion defies typical characterization by imaging, but appears roughly stable in appearance dating back to 2016, suggesting a benign lesion, potentially granulation tissue at site of prior involuted cystic lesion. In the upper pole of the left kidney there is a 1.6 cm lesion (axial image 18 of series 4) which is centrally predominantly T1 hyperintense and T2 hyperintense, with some dependent T2 hypointensity, multiple thin internal septations, but no definitive internal enhancement on post gadolinium imaging, most compatible with a mild proteinaceous/hemorrhagic cyst (Bosniak class 3F). Exophytic T1 hyperintense, T2 isointense, nonenhancing lesion extending from the posterior aspect of the interpolar region of the left kidney measuring 2.2 cm in diameter (axial image 54 of series 1000) is stable, compatible with a mildly proteinaceous/hemorrhagic cyst (Bosniak class 2). Multiple other simple cysts (Bosniak class 1) and hemorrhagic cysts (typically tiny  subcentimeter in size) are also noted. No enlarging clearly aggressive appearing lesion is otherwise noted. No hydroureteronephrosis in the visualized portions of the abdomen. Bilateral adrenal glands are normal in appearance. Stomach/Bowel: Visualized portions are unremarkable. Vascular/Lymphatic: Atherosclerosis of the abdominal aorta, without evidence of aneurysm in the abdominal vasculature. Other: No significant volume of ascites noted in the visualized portions of the peritoneal cavity. Musculoskeletal: No aggressive appearing osseous lesions are noted in the visualized portions of the skeleton. IMPRESSION: 1. Overall, there is no significant change in multiple renal lesions, as detailed above, including Bosniak class 1, class 2, class 3F and class 3 cysts. 2. Hepatic steatosis. 3. Aortic atherosclerosis. 4. Trace right pleural effusion lying dependently. 5. Additional incidental findings, as above. Electronically Signed   By: DVinnie LangtonM.D.   On: 02/03/2018 14:55   The 10-year ASCVD risk score (Mikey BussingDC Jr., et al., 2013) is: 54%   Values used to calculate the score:     Age: [age]years     Sex: Male     Is Non-Hispanic African American:  Yes     Diabetic: Yes     Tobacco smoker: Yes     Systolic Blood Pressure: 855 mmHg     Is BP treated: Yes     HDL Cholesterol: 25.1 mg/dL     Total Cholesterol: 136 mg/dL Assessment & Plan:   Arlon was seen today for annual exam.  Diagnoses and all orders for this visit:  Tobacco use  Microcytic anemia -     CBC -     Iron, TIBC and Ferritin Panel -     Ambulatory referral to Gastroenterology  Iron deficiency -     CBC -     Iron, TIBC and Ferritin Panel -     Ambulatory referral to Gastroenterology  Esophageal dysphagia -     Ambulatory referral to Gastroenterology   I am having [name] maintain his amLODipine, losartan-hydrochlorothiazide, SitaGLIPtin-MetFORMIN HCl, tamsulosin, ferrous sulfate, aspirin EC, and SUPREP BOWEL  PREP KIT.  No orders of the defined types were placed in this encounter.  Rechecking CBC with iron levels.  Patient has cardiovascular risk of 54%.  Strongly encouraged him to stop smoking.  He was given anticipatory guidance today on quitting smoking he may try the nicotine lozenges.  Have asked for an appointment with the gastroenterologist who will be performing his colonoscopy for possible consideration of a concomitant EGD.  Follow-up: Return in about 3 months (around 06/10/2018).  Libby Maw, MD

## 2018-03-10 NOTE — Patient Instructions (Signed)
Steps to Quit Smoking Smoking tobacco can be bad for your health. It can also affect almost every organ in your body. Smoking puts you and people around you at risk for many serious long-lasting (chronic) diseases. Quitting smoking is hard, but it is one of the best things that you can do for your health. It is never too late to quit. What are the benefits of quitting smoking? When you quit smoking, you lower your risk for getting serious diseases and conditions. They can include:  Lung cancer or lung disease.  Heart disease.  Stroke.  Heart attack.  Not being able to have children (infertility).  Weak bones (osteoporosis) and broken bones (fractures).  If you have coughing, wheezing, and shortness of breath, those symptoms may get better when you quit. You may also get sick less often. If you are pregnant, quitting smoking can help to lower your chances of having a baby of low birth weight. What can I do to help me quit smoking? Talk with your doctor about what can help you quit smoking. Some things you can do (strategies) include:  Quitting smoking totally, instead of slowly cutting back how much you smoke over a period of time.  Going to in-person counseling. You are more likely to quit if you go to many counseling sessions.  Using resources and support systems, such as: ? Online chats with a counselor. ? Phone quitlines. ? Printed self-help materials. ? Support groups or group counseling. ? Text messaging programs. ? Mobile phone apps or applications.  Taking medicines. Some of these medicines may have nicotine in them. If you are pregnant or breastfeeding, do not take any medicines to quit smoking unless your doctor says it is okay. Talk with your doctor about counseling or other things that can help you.  Talk with your doctor about using more than one strategy at the same time, such as taking medicines while you are also going to in-person counseling. This can help make  quitting easier. What things can I do to make it easier to quit? Quitting smoking might feel very hard at first, but there is a lot that you can do to make it easier. Take these steps:  Talk to your family and friends. Ask them to support and encourage you.  Call phone quitlines, reach out to support groups, or work with a counselor.  Ask people who smoke to not smoke around you.  Avoid places that make you want (trigger) to smoke, such as: ? Bars. ? Parties. ? Smoke-break areas at work.  Spend time with people who do not smoke.  Lower the stress in your life. Stress can make you want to smoke. Try these things to help your stress: ? Getting regular exercise. ? Deep-breathing exercises. ? Yoga. ? Meditating. ? Doing a body scan. To do this, close your eyes, focus on one area of your body at a time from head to toe, and notice which parts of your body are tense. Try to relax the muscles in those areas.  Download or buy apps on your mobile phone or tablet that can help you stick to your quit plan. There are many free apps, such as QuitGuide from the CDC (Centers for Disease Control and Prevention). You can find more support from smokefree.gov and other websites.  This information is not intended to replace advice given to you by your health care provider. Make sure you discuss any questions you have with your health care provider. Document Released: 07/13/2009 Document   Revised: 05/14/2016 Document Reviewed: 01/31/2015 Elsevier Interactive Patient Education  2018 [employer]  

## 2018-04-14 ENCOUNTER — Ambulatory Visit: Payer: Medicare HMO | Admitting: Family Medicine

## 2018-05-06 ENCOUNTER — Other Ambulatory Visit (INDEPENDENT_AMBULATORY_CARE_PROVIDER_SITE_OTHER): Payer: Medicare HMO

## 2018-05-06 ENCOUNTER — Encounter: Payer: Self-pay | Admitting: Gastroenterology

## 2018-05-06 ENCOUNTER — Ambulatory Visit (AMBULATORY_SURGERY_CENTER): Payer: Medicare HMO | Admitting: Gastroenterology

## 2018-05-06 ENCOUNTER — Other Ambulatory Visit: Payer: Self-pay

## 2018-05-06 VITALS — BP 133/84 | HR 76 | Temp 99.5°F | Resp 19 | Ht 70.0 in | Wt 208.0 lb

## 2018-05-06 DIAGNOSIS — D508 Other iron deficiency anemias: Secondary | ICD-10-CM

## 2018-05-06 DIAGNOSIS — K2990 Gastroduodenitis, unspecified, without bleeding: Secondary | ICD-10-CM

## 2018-05-06 DIAGNOSIS — K2970 Gastritis, unspecified, without bleeding: Secondary | ICD-10-CM

## 2018-05-06 DIAGNOSIS — K269 Duodenal ulcer, unspecified as acute or chronic, without hemorrhage or perforation: Secondary | ICD-10-CM | POA: Diagnosis not present

## 2018-05-06 DIAGNOSIS — D123 Benign neoplasm of transverse colon: Secondary | ICD-10-CM | POA: Diagnosis not present

## 2018-05-06 DIAGNOSIS — K635 Polyp of colon: Secondary | ICD-10-CM

## 2018-05-06 DIAGNOSIS — D122 Benign neoplasm of ascending colon: Secondary | ICD-10-CM | POA: Diagnosis not present

## 2018-05-06 DIAGNOSIS — D124 Benign neoplasm of descending colon: Secondary | ICD-10-CM | POA: Diagnosis present

## 2018-05-06 DIAGNOSIS — D128 Benign neoplasm of rectum: Secondary | ICD-10-CM

## 2018-05-06 DIAGNOSIS — D125 Benign neoplasm of sigmoid colon: Secondary | ICD-10-CM | POA: Diagnosis not present

## 2018-05-06 LAB — CBC WITH DIFFERENTIAL/PLATELET
BASOS ABS: 0.1 10*3/uL (ref 0.0–0.1)
Basophils Relative: 1.2 % (ref 0.0–3.0)
Eosinophils Absolute: 0.1 10*3/uL (ref 0.0–0.7)
Eosinophils Relative: 0.8 % (ref 0.0–5.0)
HEMATOCRIT: 35.2 % — AB (ref 39.0–52.0)
Hemoglobin: 11.4 g/dL — ABNORMAL LOW (ref 13.0–17.0)
LYMPHS PCT: 16.5 % (ref 12.0–46.0)
Lymphs Abs: 1.6 10*3/uL (ref 0.7–4.0)
MCHC: 32.3 g/dL (ref 30.0–36.0)
MCV: 79.1 fl (ref 78.0–100.0)
MONOS PCT: 7.9 % (ref 3.0–12.0)
Monocytes Absolute: 0.8 10*3/uL (ref 0.1–1.0)
NEUTROS PCT: 73.6 % (ref 43.0–77.0)
Neutro Abs: 7.1 10*3/uL (ref 1.4–7.7)
Platelets: 377 10*3/uL (ref 150.0–400.0)
RBC: 4.45 Mil/uL (ref 4.22–5.81)
RDW: 14.1 % (ref 11.5–15.5)
WBC: 9.7 10*3/uL (ref 4.0–10.5)

## 2018-05-06 MED ORDER — OMEPRAZOLE 40 MG PO CPDR: 40.0000 mg | DELAYED_RELEASE_CAPSULE | Freq: Two times a day (BID) | ORAL | 3 refills | Status: DC

## 2018-05-06 MED ORDER — SODIUM CHLORIDE 0.9 % IV SOLN: 500.0000 mL | Freq: Once | INTRAVENOUS | Status: DC

## 2018-05-06 NOTE — Op Note (Signed)
[name] Procedure Date: 05/06/2018 2:22 PM MRN: [id number] Endoscopist: Remo Lipps P. Havery Moros , MD Age: 77 Referring MD:  Date of Birth: [birth year] Gender: Male Account #: [id number] Procedure:                Upper GI endoscopy Indications:              Iron deficiency anemia Medicines:                Monitored Anesthesia Care Procedure:                Pre-Anesthesia Assessment:                           - Prior to the procedure, a History and Physical                            was performed, and patient medications and                            allergies were reviewed. The patient's tolerance of                            previous anesthesia was also reviewed. The risks                            and benefits of the procedure and the sedation                            options and risks were discussed with the patient.                            All questions were answered, and informed consent                            was obtained. Prior Anticoagulants: The patient has                            taken no previous anticoagulant or antiplatelet                            agents. ASA Grade Assessment: II - A patient with                            mild systemic disease. After reviewing the risks                            and benefits, the patient was deemed in                            satisfactory condition to undergo the procedure.                           After obtaining informed consent, the endoscope was  passed under direct vision. Throughout the                            procedure, the patient's blood pressure, pulse, and                            oxygen saturations were monitored continuously. The                            Model GIF-[id number]) scope was introduced                            through the mouth, and advanced to the second part                            of duodenum. The upper GI  endoscopy was                            accomplished without difficulty. The patient                            tolerated the procedure well. Scope In: Scope Out: Findings:                 Esophagogastric landmarks were identified: the                            Z-line was found at 42 cm, the gastroesophageal                            junction was found at 42 cm and the upper extent of                            the gastric folds was found at 42 cm from the                            incisors.                           A single small benign appearing nodule was found at                            the gastroesophageal junction. Biopsies were taken                            with a cold forceps for histology.                           The exam of the esophagus was otherwise normal.                           Patchy mild inflammation characterized by adherent                            blood and  erythema was found in the gastric antrum.                            Biopsies were taken with a cold forceps for                            histology from the antrum, body, and incisura for H                            pylori.                           The exam of the stomach was otherwise normal.                           One non-bleeding superficial linear duodenal ulcer                            with no stigmata of bleeding was found in the                            duodenal bulb. There was significant duodenitis                            with scarring of the duodenal bulb and deformed                            lumen. Biopsies were taken with a cold forceps for                            histology.                           The exam of the duodenum was otherwise normal. Complications:            No immediate complications. Estimated blood loss:                            Minimal. Estimated Blood Loss:     Estimated blood loss was minimal. Impression:               - Esophagogastric  landmarks identified.                           - Nodule found at the GEJ. Biopsied.                           - Normal esophagus                           - Gastritis. Biopsied.                           - One non-bleeding duodenal ulcer with no stigmata  of bleeding / duodenitis / deformed duodenal bulb.                            Biopsied.                           Anemia may be multifactorial due to duodenitis,                            gastritis, colonic lesions noted on colonoscopy. Recommendation:           - Patient has a contact number available for                            emergencies. The signs and symptoms of potential                            delayed complications were discussed with the                            patient. Return to normal activities tomorrow.                            Written discharge instructions were provided to the                            patient.                           - Resume previous diet.                           - Continue present medications.                           - Start omeprazole 40mg  twice daily for 4 weeks,                            then reduce to once daily                           - Await pathology results.                           - Please go to the lab for CBC to ensure stable Hgb                           - NO NSAIDs Remo Lipps P. Sylvi Rybolt, MD 05/06/2018 3:51:44 PM This report has been signed electronically.

## 2018-05-06 NOTE — Patient Instructions (Addendum)
YOU HAD AN ENDOSCOPIC PROCEDURE TODAY AT [facility]:   Refer to the procedure report that was given to you for any specific questions about what was found during the examination.  If the procedure report does not answer your questions, please call your gastroenterologist to clarify.  If you requested that your care partner not be given the details of your procedure findings, then the procedure report has been included in a sealed envelope for you to review at your convenience later.  YOU SHOULD EXPECT: Some feelings of bloating in the abdomen. Passage of more gas than usual.  Walking can help get rid of the air that was put into your GI tract during the procedure and reduce the bloating. If you had a lower endoscopy (such as a colonoscopy or flexible sigmoidoscopy) you may notice spotting of blood in your stool or on the toilet paper. If you underwent a bowel prep for your procedure, you may not have a normal bowel movement for a few days.  Please Note:  You might notice some irritation and congestion in your nose or some drainage.  This is from the oxygen used during your procedure.  There is no need for concern and it should clear up in a day or so.  SYMPTOMS TO REPORT IMMEDIATELY:  Do not use    Following lower endoscopy (colonoscopy or flexible sigmoidoscopy):  Excessive amounts of blood in the stool  Significant tenderness or worsening of abdominal pains  Swelling of the abdomen that is new, acute  Fever of 100F or higher   Following upper endoscopy (EGD)  Vomiting of blood or coffee ground material  New chest pain or pain under the shoulder blades  Painful or persistently difficult swallowing  New shortness of breath  Fever of 100F or higher  Black, tarry-looking stools  For urgent or emergent issues, a gastroenterologist can be reached at any hour by calling [phone].   DIET:  We do recommend a small meal at first, but then you may proceed to your regular  diet.  Drink plenty of fluids but you should avoid alcoholic beverages for 24 hours.  ACTIVITY:  You should plan to take it easy for the rest of today and you should NOT DRIVE or use heavy machinery until tomorrow (because of the sedation medicines used during the test).    FOLLOW UP: Our staff will call the number listed on your records the next business day following your procedure to check on you and address any questions or concerns that you may have regarding the information given to you following your procedure. If we do not reach you, we will leave a message.  However, if you are feeling well and you are not experiencing any problems, there is no need to return our call.  We will assume that you have returned to your regular daily activities without incident.  If any biopsies were taken you will be contacted by phone or by letter within the next 1-3 weeks.  Please call us at [phone] if you have not heard about the biopsies in 3 weeks.    SIGNATURES/CONFIDENTIALITY: You and/or your care partner have signed paperwork which will be entered into your electronic medical record.  These signatures attest to the fact that that the information above on your After Visit Summary has been reviewed and is understood.  Full responsibility of the confidentiality of this discharge information lies with you and/or your care-partner.  Take your prolosec  Twice daily  for four weeks then once daily until further notice.   To lab for CBC today.

## 2018-05-06 NOTE — Progress Notes (Signed)
Report to PACU, RN, vss, BBS= Clear.  

## 2018-05-06 NOTE — Progress Notes (Signed)
Penny in recovery called and asked me to put in order for cbc per Dr. Havery Moros.

## 2018-05-06 NOTE — Progress Notes (Signed)
Pt's states no medical or surgical changes since previsit or office visit. 

## 2018-05-06 NOTE — Progress Notes (Signed)
Called to room to assist during endoscopic procedure.  Patient ID and intended procedure confirmed with present staff. Received instructions for my participation in the procedure from the performing physician.  

## 2018-05-06 NOTE — Op Note (Addendum)
[place] Patient Name: Raymond Pope Procedure Date: 05/06/2018 2:22 PM MRN: [id number] Endoscopist: Remo Lipps P. Havery Moros , MD Age: 77 Referring MD:  Date of Birth: 04/26/1941 Gender: Male Account #: [id number] Procedure:                Colonoscopy Indications:              Iron deficiency anemia Medicines:                Monitored Anesthesia Care Procedure:                Pre-Anesthesia Assessment:                           - Prior to the procedure, a History and Physical                            was performed, and patient medications and                            allergies were reviewed. The patient's tolerance of                            previous anesthesia was also reviewed. The risks                            and benefits of the procedure and the sedation                            options and risks were discussed with the patient.                            All questions were answered, and informed consent                            was obtained. Prior Anticoagulants: The patient has                            taken no previous anticoagulant or antiplatelet                            agents. ASA Grade Assessment: II - A patient with                            mild systemic disease. After reviewing the risks                            and benefits, the patient was deemed in                            satisfactory condition to undergo the procedure.                           After obtaining informed consent, the colonoscope  was passed under direct vision. Throughout the                            procedure, the patient's blood pressure, pulse, and                            oxygen saturations were monitored continuously. The                            Colonoscope was introduced through the anus and                            advanced to the the cecum, identified by                            appendiceal orifice and ileocecal valve. The                             colonoscopy was performed without difficulty. The                            patient tolerated the procedure well. The quality                            of the bowel preparation was fair. The ileocecal                            valve, appendiceal orifice, and rectum were                            photographed. Scope In: 2:50:11 PM Scope Out: 3:30:53 PM Scope Withdrawal Time: 0 hours 35 minutes 44 seconds  Total Procedure Duration: 0 hours 40 minutes 42 seconds  Findings:                 The perianal and digital rectal examinations were                            normal.                           A single small angiodysplastic lesion without                            bleeding was found in the cecum.                           A 5 mm polyp was found in the ascending colon. The                            polyp was sessile. The polyp was removed with a                            cold snare. Resection and retrieval were complete.  Two sessile polyps were found in the transverse                            colon. The polyps were 4 to 5 mm in size. These                            polyps were removed with a cold snare. Resection                            and retrieval were complete.                           A large lobulated polypoid lesion was found at the                            splenic flexure. The lesion was sessile or                            semipedunclated and the based could not clearly be                            seen. There was another pedunculated polyp arising                            from near the base of this lesion. Biopsies of the                            largest lesion were taken with a cold forceps for                            histology. Area was tattooed with an injection of                            Spot (carbon black) distal to the lesion.                           A 5 mm polyp was found in the descending  colon. The                            polyp was pedunculated. The polyp was removed with                            a hot snare. Resection and retrieval were complete.                           A 5 mm polyp was found in the descending colon. The                            polyp was sessile. The polyp was removed with a  cold snare. Resection and retrieval were complete.                           Three sessile polyps were found in the sigmoid                            colon. The polyps were 3 to 5 mm in size. These                            polyps were removed with a cold snare. Resection                            and retrieval were complete.                           Two pedunculated polyps were found in the sigmoid                            colon. The polyps were 5 to 6 mm in size. These                            polyps were removed with a hot snare. Resection and                            retrieval were complete.                           A 12 mm polyp was found in the rectum, within 1cm                            of the dentate line. The polyp was sessile. Area                            was successfully injected with saline for a lift                            polypectomy. The polyp was removed with a hot                            snare. Resection and retrieval were complete.                            Tattoo not placed given close proximity to the                            dentate line.                           Internal hemorrhoids were found during retroflexion.                           A moderate amount of liquid stool was found in the  entire colon, making visualization difficult.                            Lavage of the area was performed using copious                            amounts of sterile water, resulting in clearance                            with fair visualization.                           The exam was  otherwise without abnormality. Complications:            No immediate complications. Estimated blood loss:                            Minimal. Estimated Blood Loss:     Estimated blood loss was minimal. Impression:               - Preparation of the colon was fair, worst in the                            right colon. Small or flat polyps may not have been                            appreciated.                           - A single non-bleeding colonic angiodysplastic                            lesion.                           - One 5 mm polyp in the ascending colon, removed                            with a cold snare. Resected and retrieved.                           - Two 4 to 5 mm polyps in the transverse colon,                            removed with a cold snare. Resected and retrieved.                           - Large polypoid lesion at the splenic flexure.                            Biopsied. Tattooed. It's possible this could be                            adenomatous but need to rule out malignancy - will  likely need CT scan and await path to determine if                            this is amenable to endoscopic therapy or warrant                            surgery.                           - One 5 mm polyp in the descending colon, removed                            with a hot snare. Resected and retrieved.                           - One 5 mm polyp in the descending colon, removed                            with a cold snare. Resected and retrieved.                           - Three 3 to 5 mm polyps in the sigmoid colon,                            removed with a cold snare. Resected and retrieved.                           - Two 5 to 6 mm polyps in the sigmoid colon,                            removed with a hot snare. Resected and retrieved.                           - One 12 mm polyp in the rectum, removed with a hot                             snare. Resected and retrieved. Injected.                           - Internal hemorrhoids.                           - Stool in the entire examined colon.                           - The examination was otherwise normal. Recommendation:           - Patient has a contact number available for                            emergencies. The signs and symptoms of potential  delayed complications were discussed with the                            patient. Return to normal activities tomorrow.                            Written discharge instructions were provided to the                            patient.                           - Resume previous diet.                           - Continue present medications.                           - Await pathology results with further                            recommendations                           - No ibuprofen, naproxen, or other non-steroidal                            anti-inflammatory drugs for 2 weeks after polyp                            removal. Remo Lipps P. Sharod Petsch, MD 05/06/2018 3:45:16 PM This report has been signed electronically.

## 2018-05-07 ENCOUNTER — Other Ambulatory Visit: Payer: Self-pay

## 2018-05-07 ENCOUNTER — Telehealth: Payer: Self-pay | Admitting: Gastroenterology

## 2018-05-07 ENCOUNTER — Telehealth: Payer: Self-pay

## 2018-05-07 MED ORDER — OMEPRAZOLE 40 MG PO CPDR: 40.0000 mg | DELAYED_RELEASE_CAPSULE | Freq: Two times a day (BID) | ORAL | 3 refills | Status: DC

## 2018-05-07 NOTE — Telephone Encounter (Signed)
Spoke to daughter, prescription resent and had to be faxed to the pharmacy. She is aware that she needs to check with them a little later to see if ready.

## 2018-05-07 NOTE — Telephone Encounter (Signed)
  Follow up Call-  Call back number 05/06/2018  Post procedure Call Back phone  # [phone]  Permission to leave phone message Yes  Some recent data might be hidden     Patient questions:  Do you have a fever, pain , or abdominal swelling? No. Pain Score  0 *  Have you tolerated food without any problems? Yes.    Have you been able to return to your normal activities? Yes.    Do you have any questions about your discharge instructions: Diet   No. Medications  No. Follow up visit  No.  Do you have questions or concerns about your Care? No.  Actions: * If pain score is 4 or above: No action needed, pain <4.

## 2018-05-08 ENCOUNTER — Telehealth: Payer: Self-pay | Admitting: Gastroenterology

## 2018-05-11 ENCOUNTER — Telehealth: Payer: Self-pay

## 2018-05-11 NOTE — Telephone Encounter (Signed)
-----   Message from Irving Copas., MD sent at 05/11/2018 12:25 AM EDT ----- Regarding: RE: large colon polyp Richardson Landry, Interesting colonoscopy findings. I think, looking at your pictures, it is worth considering a repeat colonoscopy attempt with possible EMR. Being unable to clearly visualize the base, I'm not sure I would plan to attempt colonoscopy unless we potentially had access to an Endoloop/Polyloop, because if it is a broad base, may be helpful to have access to this to decrease risk of bleeding intra and post-procedure. Santiago Glad, is this a device that we have in stock ([employer] makes the more common version of this)? Also, I'll try and get a sense of the snares that we have at the hospital setting, I have a few that I like to use for EMR, that we can dicsuss. Majesty Oehlert, happy to see in clinic but would not get scheduled for colonoscopy until our clinic visit is completed and I can confirm our tools are available. Thanks. Gabe ----- Message ----- From: Yetta Flock, MD Sent: 05/08/2018   4:30 PM EDT To: Timothy Lasso, RN, Irving Copas., MD Subject: large colon polyp                              Hi Gabe,  This is the guy I mentioned to you yesterday. Large splenic flexure polypoid lesion, superficial biopsies show adenomatous change. I think it may be endoscopically resectable, although was tough to see the entire base of it, possible this is harboring malignancy within it. Would you mind taking a look at the endoscopic images and let me know what you think? If you are interested in it, we can coordinate a clinic visit for you to discuss it with him. Thanks again.  Richardson Landry

## 2018-05-11 NOTE — Telephone Encounter (Signed)
appt made for 06/15/18 at 1:45 pm with Dr Rush Landmark

## 2018-05-11 NOTE — Telephone Encounter (Signed)
The pt has been advised of the information and verbalized understanding.    

## 2018-05-11 NOTE — Telephone Encounter (Signed)
Called patient previously, see path result for full discussion

## 2018-06-03 ENCOUNTER — Encounter: Payer: Self-pay | Admitting: Family Medicine

## 2018-06-03 ENCOUNTER — Ambulatory Visit (INDEPENDENT_AMBULATORY_CARE_PROVIDER_SITE_OTHER): Payer: Medicare HMO | Admitting: Family Medicine

## 2018-06-03 VITALS — BP 126/80 | HR 103 | Ht 70.0 in | Wt 195.4 lb

## 2018-06-03 DIAGNOSIS — M5431 Sciatica, right side: Secondary | ICD-10-CM | POA: Diagnosis not present

## 2018-06-03 DIAGNOSIS — M25511 Pain in right shoulder: Secondary | ICD-10-CM | POA: Insufficient documentation

## 2018-06-03 DIAGNOSIS — M25512 Pain in left shoulder: Secondary | ICD-10-CM

## 2018-06-03 DIAGNOSIS — R972 Elevated prostate specific antigen [PSA]: Secondary | ICD-10-CM | POA: Diagnosis not present

## 2018-06-03 DIAGNOSIS — M5432 Sciatica, left side: Secondary | ICD-10-CM

## 2018-06-03 DIAGNOSIS — D509 Iron deficiency anemia, unspecified: Secondary | ICD-10-CM

## 2018-06-03 LAB — PSA: PSA: 6.46 ng/mL — ABNORMAL HIGH (ref 0.10–4.00)

## 2018-06-03 LAB — CBC
HEMATOCRIT: 29.3 % — AB (ref 39.0–52.0)
HEMOGLOBIN: 9.3 g/dL — AB (ref 13.0–17.0)
MCHC: 31.5 g/dL (ref 30.0–36.0)
MCV: 75 fl — ABNORMAL LOW (ref 78.0–100.0)
Platelets: 440 10*3/uL — ABNORMAL HIGH (ref 150.0–400.0)
RBC: 3.91 Mil/uL — ABNORMAL LOW (ref 4.22–5.81)
RDW: 14.7 % (ref 11.5–15.5)
WBC: 13.3 10*3/uL — AB (ref 4.0–10.5)

## 2018-06-03 MED ORDER — PREDNISONE 10 MG (21) PO TBPK: ORAL_TABLET | ORAL | 0 refills | Status: DC

## 2018-06-03 MED ORDER — KETOROLAC TROMETHAMINE 60 MG/2ML IM SOLN
60.0000 mg | Freq: Once | INTRAMUSCULAR | Status: AC
  Administered 2018-06-03: 60 mg via INTRAMUSCULAR

## 2018-06-03 MED ORDER — FERROUS SULFATE 325 (65 FE) MG PO TABS: 325.0000 mg | ORAL_TABLET | Freq: Every day | ORAL | 1 refills | Status: DC

## 2018-06-03 MED ORDER — TRAMADOL HCL 50 MG PO TABS: ORAL_TABLET | ORAL | 0 refills | Status: DC

## 2018-06-03 NOTE — Progress Notes (Addendum)
Subjective:  Patient ID: Raymond Pope, male    DOB: 17-Feb-1941  Age: 77 y.o. MRN: [id number]  CC: Leg Pain   HPI Raymond Pope presents for evaluation of a 3-week history of pains that are moving down the backs of both legs.  Lower back seems to be okay.  Raymond Pope is having no saddle saddle paresthesias.  Denies weakness in his legs.  Standing burning paresthesias in his feet that are intermittent.  Past medical history of controlled diabetes.  History of mildly elevated PSA with BPH on exam.  Urine flow is good.  Raymond Pope has been using a spray with some relief.  Raymond Pope is also having bilateral shoulder stiffness and aching.  Raymond Pope stopped taking iron because Raymond Pope ran out of it.  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  Outpatient Medications Prior to Visit  Medication Sig Dispense Refill  . amLODipine (NORVASC) 10 MG tablet Take 1 tablet (10 mg total) by mouth daily. 100 tablet 1  . aspirin EC 81 MG tablet Take 81 mg by mouth daily.    Marland Kitchen losartan-hydrochlorothiazide (HYZAAR) 100-12.5 MG tablet Take 1 tablet by mouth daily. 100 tablet 1  . omeprazole (PRILOSEC) 40 MG capsule Take 1 capsule (40 mg total) by mouth 2 (two) times daily before a meal. Take twice per day before meals for 1 month, and then once per day before breakfast until further notice 120 capsule 3  . SitaGLIPtin-MetFORMIN HCl (JANUMET XR) 50-500 MG TB24 Take 1 tablet by mouth 2 (two) times daily. 180 tablet 1  . tamsulosin (FLOMAX) 0.4 MG CAPS capsule Take 1 capsule (0.4 mg total) by mouth 1 day or 1 dose. 100 capsule 1  . ferrous sulfate 325 (65 FE) MG tablet Take 1 tablet (325 mg total) by mouth daily with breakfast. 90 tablet 1   No facility-administered medications prior to visit.     ROS Review of Systems  Constitutional: Negative.   HENT: Negative.   Eyes: Negative.   Respiratory: Negative.   Gastrointestinal: Positive for constipation. Negative  for anal bleeding and blood in stool.  Endocrine: Negative for polyphagia and polyuria.  Genitourinary: Negative for decreased urine volume and difficulty urinating.  Musculoskeletal: Positive for arthralgias. Negative for back pain.  Skin: Negative.   Allergic/Immunologic: Negative for immunocompromised state.  Neurological: Positive for numbness. Negative for weakness.  Hematological: Does not bruise/bleed easily.  Psychiatric/Behavioral: Negative.     Objective:  BP 126/80   Pulse (!) 103   Ht 5\' 10"  (1.778 m)   Wt 195 lb 6 oz (88.6 kg)   SpO2 98%   BMI 28.03 kg/m   BP Readings from Last 3 Encounters:  06/03/18 126/80  05/06/18 133/84  03/10/18 118/72    Wt Readings from Last 3 Encounters:  06/03/18 195 lb 6 oz (88.6 kg)  05/06/18 208 lb (94.3 kg)  03/10/18 208 lb 8 oz (94.6 kg)    Physical Exam  Constitutional: Raymond Pope appears well-developed and well-nourished. No distress.  HENT:  Head: Normocephalic and atraumatic.  Right Ear: External ear normal.  Left Ear: External ear normal.  Eyes: Right eye exhibits no discharge. Left eye exhibits no discharge. No scleral icterus.  Neck: No JVD present.  Pulmonary/Chest: Effort normal.  Musculoskeletal:       Lumbar back: Raymond Pope exhibits normal range of motion, no tenderness, no pain and no spasm.       Back:  Neurological: Raymond Pope has normal strength.  Reflex Scores:      Patellar reflexes are 2+ on the right side and 2+ on the left side.      Achilles reflexes are 1+ on the right side and 1+ on the left side. Negative dural tension signs.   Skin: Raymond Pope is not diaphoretic.    Lab Results  Component Value Date   WBC 13.3 (H) 06/03/2018   HGB 9.3 (L) 06/03/2018   HCT 29.3 (L) 06/03/2018   PLT 440.0 (H) 06/03/2018   GLUCOSE 111 (H) 01/13/2018   CHOL 136 01/13/2018   TRIG 177.0 (H) 01/13/2018   HDL 25.10 (L) 01/13/2018   LDLCALC 76 01/13/2018   ALT 10 01/13/2018   AST 9 01/13/2018   NA 139 01/13/2018   K 4.3 01/13/2018   CL  104 01/13/2018   CREATININE 1.39 01/13/2018   BUN 25 (H) 01/13/2018   CO2 28 01/13/2018   TSH 1.94 01/13/2018   PSA 6.46 (H) 06/03/2018   HGBA1C 6.4 01/13/2018   MICROALBUR 1.5 01/13/2018    Mr Abdomen Wwo Contrast  Result Date: 02/03/2018 CLINICAL DATA:  78 year old male with history of complex renal cyst. Follow-up study. EXAM: MRI ABDOMEN WITHOUT AND WITH CONTRAST TECHNIQUE: Multiplanar multisequence MR imaging of the abdomen was performed both before and after the administration of intravenous contrast. CONTRAST:  87mL MULTIHANCE GADOBENATE DIMEGLUMINE 529 MG/ML IV SOLN COMPARISON:  MRI of the abdomen 04/29/2017. FINDINGS: Lower chest: Trace right pleural effusion lying dependently. Hepatobiliary: Diffuse loss of signal intensity throughout the hepatic parenchyma, compatible with severe hepatic steatosis. Multiple T1 hypointense, T2 hyperintense, nonenhancing lesions are noted throughout the liver, compatible with cysts and/or biliary hamartomas. The largest simple cyst is in segment 2 of the liver measuring 18 x 17 mm (axial image 7 of series 4). No other suspicious appearing hepatic lesions are noted. No intra or extrahepatic biliary ductal dilatation. Gallbladder is normal in appearance. Pancreas: No pancreatic mass. No pancreatic ductal dilatation. No pancreatic or peripancreatic fluid or inflammatory changes. Spleen:  Unremarkable. Adrenals/Urinary Tract: Multiple renal lesions of varying degrees of complexity are again noted, appearing similar in size, number and distribution to the prior study. The most concerning of these lesions is in the posterior aspect of the upper pole of the right kidney (axial image 27 of series 4) measuring 2.7 cm in diameter, demonstrating predominantly T1 hypointensity and T2 hyperintensity, with some posterior T2 hypointensity which is mildly T1 hyperintense, and appears to demonstrates some slight enhancement on post gadolinium imaging (Bosniak class 3).  Overall, this lesion is unchanged compared to the prior examination. Also in the posterior aspect of the lower pole the right kidney (axial image 61 of series 1001) there is an exophytic 13 mm lesion that is low to intermediate T1 signal intensity, isodose slightly hypointense on T2 weighted images, and demonstrates some mild enhancement. This lesion defies typical characterization by imaging, but appears roughly stable in appearance dating back to 2016, suggesting a benign lesion, potentially granulation tissue at site of prior involuted cystic lesion. In the upper pole of the left kidney there is a 1.6 cm lesion (axial image 18 of series 4) which is centrally predominantly T1 hyperintense and T2 hyperintense, with some dependent T2 hypointensity, multiple thin internal septations, but no definitive internal enhancement on post gadolinium imaging, most compatible with a mild proteinaceous/hemorrhagic cyst (Bosniak class 77F). Exophytic T1 hyperintense, T2 isointense, nonenhancing lesion extending from the posterior aspect of the interpolar region of the  left kidney measuring 2.2 cm in diameter (axial image 54 of series 1000) is stable, compatible with a mildly proteinaceous/hemorrhagic cyst (Bosniak class 2). Multiple other simple cysts (Bosniak class 1) and hemorrhagic cysts (typically tiny subcentimeter in size) are also noted. No enlarging clearly aggressive appearing lesion is otherwise noted. No hydroureteronephrosis in the visualized portions of the abdomen. Bilateral adrenal glands are normal in appearance. Stomach/Bowel: Visualized portions are unremarkable. Vascular/Lymphatic: Atherosclerosis of the abdominal aorta, without evidence of aneurysm in the abdominal vasculature. Other: No significant volume of ascites noted in the visualized portions of the peritoneal cavity. Musculoskeletal: No aggressive appearing osseous lesions are noted in the visualized portions of the skeleton. IMPRESSION: 1. Overall,  there is no significant change in multiple renal lesions, as detailed above, including Bosniak class 1, class 2, class 79F and class 3 cysts. 2. Hepatic steatosis. 3. Aortic atherosclerosis. 4. Trace right pleural effusion lying dependently. 5. Additional incidental findings, as above. Electronically Signed   By: Vinnie Langton M.D.   On: 02/03/2018 14:55    Assessment & Plan:   Raymond Pope was seen today for leg pain.  Diagnoses and all orders for this visit:  Bilateral shoulder pain, unspecified chronicity -     ketorolac (TORADOL) injection 60 mg -     predniSONE (STERAPRED UNI-PAK 21 TAB) 10 MG (21) TBPK tablet; Pharm to instruct 6 day taper(6,5,4,3,2,1) -     traMADol (ULTRAM) 50 MG tablet; Take one to two at night as needed  Microcytic anemia -     ferrous sulfate 325 (65 FE) MG tablet; Take 1 tablet (325 mg total) by mouth daily with breakfast. -     CBC -     Iron, TIBC and Ferritin Panel  Bilateral sciatica -     ketorolac (TORADOL) injection 60 mg -     predniSONE (STERAPRED UNI-PAK 21 TAB) 10 MG (21) TBPK tablet; Pharm to instruct 6 day taper(6,5,4,3,2,1) -     traMADol (ULTRAM) 50 MG tablet; Take one to two at night as needed -     DG Lumbar Spine Complete; Future  Elevated PSA -     PSA -     Ambulatory referral to Urology   I am having Raymond Pope start on predniSONE and traMADol. I am also having Raymond Pope maintain his amLODipine, losartan-hydrochlorothiazide, SitaGLIPtin-MetFORMIN HCl, tamsulosin, aspirin EC, omeprazole, and ferrous sulfate. We administered ketorolac.  Meds ordered this encounter  Medications  . ferrous sulfate 325 (65 FE) MG tablet    Sig: Take 1 tablet (325 mg total) by mouth daily with breakfast.    Dispense:  90 tablet    Refill:  1  . ketorolac (TORADOL) injection 60 mg  . predniSONE (STERAPRED UNI-PAK 21 TAB) 10 MG (21) TBPK tablet    Sig: Pharm to instruct 6 day taper(6,5,4,3,2,1)    Dispense:  21 tablet    Refill:  0  . traMADol (ULTRAM)  50 MG tablet    Sig: Take one to two at night as needed    Dispense:  15 tablet    Refill:  0     Follow-up: Return in about 8 days (around 06/11/2018).  Libby Maw, MD

## 2018-06-04 LAB — IRON,TIBC AND FERRITIN PANEL
%SAT: 2 % (calc) — ABNORMAL LOW (ref 20–48)
FERRITIN: 407 ng/mL — AB (ref 24–380)
Iron: <10 ug/dL — ABNORMAL LOW (ref 50–180)
TIBC: 218 mcg/dL (calc) — ABNORMAL LOW (ref 250–425)

## 2018-06-04 NOTE — Addendum Note (Signed)
Addended by: Jon Billings on: 06/04/2018 09:33 AM   Modules accepted: Orders

## 2018-06-11 ENCOUNTER — Ambulatory Visit: Payer: Medicare HMO | Admitting: Family Medicine

## 2018-06-12 ENCOUNTER — Ambulatory Visit (INDEPENDENT_AMBULATORY_CARE_PROVIDER_SITE_OTHER): Payer: Medicare HMO | Admitting: Family Medicine

## 2018-06-12 ENCOUNTER — Encounter: Payer: Self-pay | Admitting: Family Medicine

## 2018-06-12 VITALS — BP 118/72 | HR 92 | Temp 98.1°F | Ht 70.0 in | Wt 195.6 lb

## 2018-06-12 DIAGNOSIS — R972 Elevated prostate specific antigen [PSA]: Secondary | ICD-10-CM | POA: Diagnosis not present

## 2018-06-12 DIAGNOSIS — M25511 Pain in right shoulder: Secondary | ICD-10-CM

## 2018-06-12 DIAGNOSIS — D509 Iron deficiency anemia, unspecified: Secondary | ICD-10-CM | POA: Diagnosis not present

## 2018-06-12 DIAGNOSIS — D123 Benign neoplasm of transverse colon: Secondary | ICD-10-CM

## 2018-06-12 DIAGNOSIS — M25512 Pain in left shoulder: Secondary | ICD-10-CM

## 2018-06-12 NOTE — Progress Notes (Signed)
Subjective:  Patient ID: Raymond Pope, male    DOB: 10-01-1940  Age: 77 y.o. MRN: [id number]  CC: Follow-up   HPI Raymond Pope presents for follow-up of his bilateral sciatica and shoulder pain.  These are doing much better after the prednisone burst and nightly Ultram.  He is sleeping better with that.  Urine flow is improved on the Flomax but he is still experiencing nocturia.  He has not received notification about a urology follow-up for this issue and he is PSA velocity.  He is scheduled for consultation with general surgery for removal of his large adenomatous polyp in the splenic flexure.  He is taking iron supplementation but only once a day.  He is dealing with some constipation.  Outpatient Medications Prior to Visit  Medication Sig Dispense Refill  . amLODipine (NORVASC) 10 MG tablet Take 1 tablet (10 mg total) by mouth daily. 100 tablet 1  . aspirin EC 81 MG tablet Take 81 mg by mouth daily.    . ferrous sulfate 325 (65 FE) MG tablet Take 1 tablet (325 mg total) by mouth daily with breakfast. 90 tablet 1  . losartan-hydrochlorothiazide (HYZAAR) 100-12.5 MG tablet Take 1 tablet by mouth daily. 100 tablet 1  . omeprazole (PRILOSEC) 40 MG capsule Take 1 capsule (40 mg total) by mouth 2 (two) times daily before a meal. Take twice per day before meals for 1 month, and then once per day before breakfast until further notice 120 capsule 3  . predniSONE (STERAPRED UNI-PAK 21 TAB) 10 MG (21) TBPK tablet Pharm to instruct 6 day taper(6,5,4,3,2,1) 21 tablet 0  . SitaGLIPtin-MetFORMIN HCl (JANUMET XR) 50-500 MG TB24 Take 1 tablet by mouth 2 (two) times daily. 180 tablet 1  . tamsulosin (FLOMAX) 0.4 MG CAPS capsule Take 1 capsule (0.4 mg total) by mouth 1 day or 1 dose. 100 capsule 1  . traMADol (ULTRAM) 50 MG tablet Take one to two at night as needed 15 tablet 0   No facility-administered medications prior to visit.     ROS Review of Systems  Constitutional: Negative for fatigue,  fever and unexpected weight change.  HENT: Negative.   Eyes: Negative for photophobia and visual disturbance.  Respiratory: Negative.   Cardiovascular: Negative.   Gastrointestinal: Positive for constipation. Negative for anal bleeding and blood in stool.  Genitourinary: Negative for hematuria.  Skin: Negative for pallor and rash.  Allergic/Immunologic: Negative for immunocompromised state.  Neurological: Negative for headaches.  Hematological: Does not bruise/bleed easily.  Psychiatric/Behavioral: Negative.     Objective:  BP 118/72 (BP Location: Left Arm, Patient Position: Sitting, Cuff Size: Normal)   Pulse 92   Temp 98.1 F (36.7 C) (Oral)   Ht 5\' 10"  (1.778 m)   Wt 195 lb 9.6 oz (88.7 kg)   SpO2 96%   BMI 28.07 kg/m   BP Readings from Last 3 Encounters:  06/12/18 118/72  06/03/18 126/80  05/06/18 133/84    Wt Readings from Last 3 Encounters:  06/12/18 195 lb 9.6 oz (88.7 kg)  06/03/18 195 lb 6 oz (88.6 kg)  05/06/18 208 lb (94.3 kg)    Physical Exam  Constitutional: He is oriented to person, place, and time. He appears well-developed and well-nourished. No distress.  HENT:  Head: Normocephalic and atraumatic.  Right Ear: External ear normal.  Left Ear: External ear normal.  Eyes: Right eye exhibits no discharge. Left eye exhibits no discharge. No scleral icterus.  Neck: No JVD present. No tracheal deviation  present.  Pulmonary/Chest: Effort normal.  Neurological: He is alert and oriented to person, place, and time.  Skin: He is not diaphoretic.  Psychiatric: He has a normal mood and affect. His behavior is normal.    Lab Results  Component Value Date   WBC 13.3 (H) 06/03/2018   HGB 9.3 (L) 06/03/2018   HCT 29.3 (L) 06/03/2018   PLT 440.0 (H) 06/03/2018   GLUCOSE 111 (H) 01/13/2018   CHOL 136 01/13/2018   TRIG 177.0 (H) 01/13/2018   HDL 25.10 (L) 01/13/2018   LDLCALC 76 01/13/2018   ALT 10 01/13/2018   AST 9 01/13/2018   NA 139 01/13/2018   K 4.3  01/13/2018   CL 104 01/13/2018   CREATININE 1.39 01/13/2018   BUN 25 (H) 01/13/2018   CO2 28 01/13/2018   TSH 1.94 01/13/2018   PSA 6.46 (H) 06/03/2018   HGBA1C 6.4 01/13/2018   MICROALBUR 1.5 01/13/2018    Mr Abdomen Wwo Contrast  Result Date: 02/03/2018 CLINICAL DATA:  77 year old male with history of complex renal cyst. Follow-up study. EXAM: MRI ABDOMEN WITHOUT AND WITH CONTRAST TECHNIQUE: Multiplanar multisequence MR imaging of the abdomen was performed both before and after the administration of intravenous contrast. CONTRAST:  67mL MULTIHANCE GADOBENATE DIMEGLUMINE 529 MG/ML IV SOLN COMPARISON:  MRI of the abdomen 04/29/2017. FINDINGS: Lower chest: Trace right pleural effusion lying dependently. Hepatobiliary: Diffuse loss of signal intensity throughout the hepatic parenchyma, compatible with severe hepatic steatosis. Multiple T1 hypointense, T2 hyperintense, nonenhancing lesions are noted throughout the liver, compatible with cysts and/or biliary hamartomas. The largest simple cyst is in segment 2 of the liver measuring 18 x 17 mm (axial image 7 of series 4). No other suspicious appearing hepatic lesions are noted. No intra or extrahepatic biliary ductal dilatation. Gallbladder is normal in appearance. Pancreas: No pancreatic mass. No pancreatic ductal dilatation. No pancreatic or peripancreatic fluid or inflammatory changes. Spleen:  Unremarkable. Adrenals/Urinary Tract: Multiple renal lesions of varying degrees of complexity are again noted, appearing similar in size, number and distribution to the prior study. The most concerning of these lesions is in the posterior aspect of the upper pole of the right kidney (axial image 27 of series 4) measuring 2.7 cm in diameter, demonstrating predominantly T1 hypointensity and T2 hyperintensity, with some posterior T2 hypointensity which is mildly T1 hyperintense, and appears to demonstrates some slight enhancement on post gadolinium imaging  (Bosniak class 3). Overall, this lesion is unchanged compared to the prior examination. Also in the posterior aspect of the lower pole the right kidney (axial image 61 of series 1001) there is an exophytic 13 mm lesion that is low to intermediate T1 signal intensity, isodose slightly hypointense on T2 weighted images, and demonstrates some mild enhancement. This lesion defies typical characterization by imaging, but appears roughly stable in appearance dating back to 2016, suggesting a benign lesion, potentially granulation tissue at site of prior involuted cystic lesion. In the upper pole of the left kidney there is a 1.6 cm lesion (axial image 18 of series 4) which is centrally predominantly T1 hyperintense and T2 hyperintense, with some dependent T2 hypointensity, multiple thin internal septations, but no definitive internal enhancement on post gadolinium imaging, most compatible with a mild proteinaceous/hemorrhagic cyst (Bosniak class 16F). Exophytic T1 hyperintense, T2 isointense, nonenhancing lesion extending from the posterior aspect of the interpolar region of the left kidney measuring 2.2 cm in diameter (axial image 54 of series 1000) is stable, compatible with a mildly proteinaceous/hemorrhagic cyst (  Bosniak class 2). Multiple other simple cysts (Bosniak class 1) and hemorrhagic cysts (typically tiny subcentimeter in size) are also noted. No enlarging clearly aggressive appearing lesion is otherwise noted. No hydroureteronephrosis in the visualized portions of the abdomen. Bilateral adrenal glands are normal in appearance. Stomach/Bowel: Visualized portions are unremarkable. Vascular/Lymphatic: Atherosclerosis of the abdominal aorta, without evidence of aneurysm in the abdominal vasculature. Other: No significant volume of ascites noted in the visualized portions of the peritoneal cavity. Musculoskeletal: No aggressive appearing osseous lesions are noted in the visualized portions of the skeleton.  IMPRESSION: 1. Overall, there is no significant change in multiple renal lesions, as detailed above, including Bosniak class 1, class 2, class 77F and class 3 cysts. 2. Hepatic steatosis. 3. Aortic atherosclerosis. 4. Trace right pleural effusion lying dependently. 5. Additional incidental findings, as above. Electronically Signed   By: Vinnie Langton M.D.   On: 02/03/2018 14:55    Assessment & Plan:   Becky was seen today for follow-up.  Diagnoses and all orders for this visit:  Elevated PSA  Microcytic anemia  Adenomatous polyp of transverse colon  Bilateral shoulder pain, unspecified chronicity   I am having [name] maintain his amLODipine, losartan-hydrochlorothiazide, SitaGLIPtin-MetFORMIN HCl, tamsulosin, aspirin EC, omeprazole, ferrous sulfate, predniSONE, and traMADol.  No orders of the defined types were placed in this encounter.  Patient will go ahead and take his iron pills twice daily.  He will watch out for dark stools and constipation.  He will start Colace twice a day as well.  Advised that general surgery may recommend a transfusion.  He is awaiting consultation with urology for his PSA velocity and BPH with LUTS.  Continue Flomax for now.  He will follow-up with me 2 to 3 weeks or as soon as he is able after his surgical excision of the large colon polyp.  Follow-up: Return 2-3 weeks after laporotomy for large colon polyp.  Libby Maw, MD

## 2018-06-15 ENCOUNTER — Encounter: Payer: Self-pay | Admitting: Gastroenterology

## 2018-06-15 ENCOUNTER — Ambulatory Visit (INDEPENDENT_AMBULATORY_CARE_PROVIDER_SITE_OTHER): Payer: Medicare HMO | Admitting: Gastroenterology

## 2018-06-15 ENCOUNTER — Encounter (INDEPENDENT_AMBULATORY_CARE_PROVIDER_SITE_OTHER): Payer: Self-pay

## 2018-06-15 VITALS — BP 90/60 | HR 105 | Ht 70.0 in | Wt 195.0 lb

## 2018-06-15 DIAGNOSIS — Z860100 Personal history of colon polyps, unspecified: Secondary | ICD-10-CM | POA: Insufficient documentation

## 2018-06-15 DIAGNOSIS — R933 Abnormal findings on diagnostic imaging of other parts of digestive tract: Secondary | ICD-10-CM

## 2018-06-15 DIAGNOSIS — D126 Benign neoplasm of colon, unspecified: Secondary | ICD-10-CM

## 2018-06-15 NOTE — Progress Notes (Signed)
[facility] VISIT   Primary Care Provider Libby Maw, [place] [address] [phone]  Referring Provider Libby Maw, MD [address] [place], [place] [address] [phone]  Patient Profile: Raymond Pope is a 77 y.o. male with a pmh significant for DM, HTN, Colon Polyps (TAs), Large SF Colon polyp (TA).  The patient presents to the [facility] for an evaluation and management of problem(s) noted below:  Problem List 1. History of colonic polyps   2. Tubular adenoma of colon   3. Abnormal colonoscopy     History of Present Illness: This is not the patient's first to the [facility].  Today, the patient comes in to discuss EMR attempt of a large splenic flexure polyp.  He has been previously seen by my partner Dr. Havery Moros for an upper and lower endoscopy in the setting of iron deficiency anemia work-up.  He had multiple polyps removed during his colonoscopy and was found to have a large tubular adenoma at the splenic flexure as well as a pedunculated polyp near the base of that polyp.  The preparation was fair at best.  He is referred for discussion of possible advanced polyp resection via endoscopy.  Since his procedure he has had some continued issues with variation in his stool pattern with days of constipation as well as softer stools in between.  He does have no significant abdominal pains.  He is accompanied by his daughter today.  His upper endoscopy revealed a gastric ulcer for which Dr. Havery Moros is planning a follow-up.  The patient has noted some darkness in his stools which he attributes to his recent initiation of iron.  Overall he is doing well.  GI Review of Systems Positive as above Negative for dysphasia, odynophagia, jaundice, melena, hematochezia  Review of Systems General: Denies fevers/chills/weight loss HEENT: Denies oral lesions Cardiovascular:  Denies chest pain Pulmonary: Denies shortness of breath Gastroenterological: See HPI Hematological: Denies easy bruising/bleeding Psychological: Mood is stable Musculoskeletal: Denies new arthralgias   Medications Current Outpatient Medications  Medication Sig Dispense Refill  . amLODipine (NORVASC) 10 MG tablet Take 1 tablet (10 mg total) by mouth daily. 100 tablet 1  . aspirin EC 81 MG tablet Take 81 mg by mouth daily.    . ferrous sulfate 325 (65 FE) MG tablet Take 1 tablet (325 mg total) by mouth daily with breakfast. 90 tablet 1  . losartan-hydrochlorothiazide (HYZAAR) 100-12.5 MG tablet Take 1 tablet by mouth daily. 100 tablet 1  . omeprazole (PRILOSEC) 40 MG capsule Take 1 capsule (40 mg total) by mouth 2 (two) times daily before a meal. Take twice per day before meals for 1 month, and then once per day before breakfast until further notice 120 capsule 3  . predniSONE (STERAPRED UNI-PAK 21 TAB) 10 MG (21) TBPK tablet Pharm to instruct 6 day taper(6,5,4,3,2,1) 21 tablet 0  . SitaGLIPtin-MetFORMIN HCl (JANUMET XR) 50-500 MG TB24 Take 1 tablet by mouth 2 (two) times daily. 180 tablet 1  . tamsulosin (FLOMAX) 0.4 MG CAPS capsule Take 1 capsule (0.4 mg total) by mouth 1 day or 1 dose. 100 capsule 1  . traMADol (ULTRAM) 50 MG tablet Take one to two at night as needed 15 tablet 0   No current facility-administered medications for this visit.     Allergies No Known Allergies  Histories Past Medical History:  Diagnosis Date  . Anemia   . Diabetes mellitus without complication ([place])   .  HTN (hypertension)   . Peptic ulcer   . Renal cyst    Past Surgical History:  Procedure Laterality Date  . NO PAST SURGERIES     Social History   Socioeconomic History  . Marital status: Widowed    Spouse name: Not on file  . Number of children: 4  . Years of education: Not on file  . Highest education level: Not on file  Occupational History  . Occupation: retired  Scientific laboratory technician  .  Financial resource strain: Not on file  . Food insecurity:    Worry: Not on file    Inability: Not on file  . Transportation needs:    Medical: Not on file    Non-medical: Not on file  Tobacco Use  . Smoking status: Current Every Day Smoker    Types: Pipe  . Smokeless tobacco: Never Used  Substance and Sexual Activity  . Alcohol use: No  . Drug use: No  . Sexual activity: Not on file  Lifestyle  . Physical activity:    Days per week: Not on file    Minutes per session: Not on file  . Stress: Not on file  Relationships  . Social connections:    Talks on phone: Not on file    Gets together: Not on file    Attends religious service: Not on file    Active member of club or organization: Not on file    Attends meetings of clubs or organizations: Not on file    Relationship status: Not on file  . Intimate partner violence:    Fear of current or ex partner: Not on file    Emotionally abused: Not on file    Physically abused: Not on file    Forced sexual activity: Not on file  Other Topics Concern  . Not on file  Social History Narrative  . Not on file   Family History  Problem Relation Age of Onset  . Breast cancer Daughter   . Diabetes Son   . Heart murmur Son   . Colon cancer Neg Hx   . Esophageal cancer Neg Hx   . Inflammatory bowel disease Neg Hx   . Liver disease Neg Hx   . Pancreatic cancer Neg Hx   . Rectal cancer Neg Hx    I have reviewed his medical, social, and family history in detail and updated the electronic medical record as necessary.    PHYSICAL EXAMINATION  BP 90/60   Pulse (!) 105   Ht 5\' 10"  (1.778 m)   Wt 195 lb (88.5 kg)   BMI 27.98 kg/m  Wt Readings from Last 3 Encounters:  06/15/18 195 lb (88.5 kg)  06/12/18 195 lb 9.6 oz (88.7 kg)  06/03/18 195 lb 6 oz (88.6 kg)  GEN: NAD, appears younger than stated age, doesn't appear chronically ill PSYCH: Cooperative, without pressured speech EYE: Conjunctivae pink, sclerae anicteric ENT:  MMM NECK: Supple CV: RR without R/Gs  RESP: CTAB posteriorly, without wheezing GI: NABS, soft, NT/ND, without rebound or guarding, no HSM appreciated MSK/EXT: Trace bilateral lower extremity edema SKIN: No jaundice NEURO:  Alert & Oriented x 3, no focal deficits   REVIEW OF DATA  I reviewed the following data at the time of this encounter:  GI Procedures and Studies  8/19 Colonoscopy - Preparation of the colon was fair, worst in the right colon. Small or flat polyps may not have been appreciated. - A single non-bleeding colonic angiodysplastic lesion. - One 5  mm polyp in the ascending colon, removed with a cold snare. Resected and retrieved. - Two 4 to 5 mm polyps in the transverse colon, removed with a cold snare. Resected and retrieved. - Large polypoid lesion at the splenic flexure. Biopsied. Tattooed. It's possible this could be adenomatous but need to rule out malignancy - will likely need CT scan and await path to determine if this is amenable to endoscopic therapy or warrant surgery. - One 5 mm polyp in the descending colon, removed with a hot snare. Resected and retrieved. - One 5 mm polyp in the descending colon, removed with a cold snare. Resected and retrieved. - Three 3 to 5 mm polyps in the sigmoid colon, removed with a cold snare. Resected and retrieved. - Two 5 to 6 mm polyps in the sigmoid colon, removed with a hot snare. Resected and retrieved. - One 12 mm polyp in the rectum, removed with a hot snare. Resected and retrieved. Injected. - Internal hemorrhoids. - Stool in the entire examined colon. - The examination was otherwise normal.  Laboratory Studies  Reviewed in epic  Imaging Studies  May 2019 MRI abdomen IMPRESSION: 1. Overall, there is no significant change in multiple renal lesions, as detailed above, including Bosniak class 1, class 2, class 44F and class 3 cysts. 2. Hepatic steatosis. 3. Aortic atherosclerosis. 4. Trace right pleural effusion  lying dependently. 5. Additional incidental findings, as above.   ASSESSMENT  Mr. Schreiner is a 77 y.o. male with a pmh significant for DM, HTN, Colon Polyps (TAs), Large SF Colon polyp (TA).  The patient is seen today for evaluation and management of:  1. History of colonic polyps   2. Tubular adenoma of colon   3. Abnormal colonoscopy    This is a clinically and hemodynamically stable patient who is referred for discussion of advanced mucosal resection/polypectomy of a large tubular adenoma in the splenic flexure.  This colonoscopy had been performed for evaluation of iron deficiency anemia though in the setting of the endoscopic views is not clear that his iron deficiency anemia as a result of this large polyp but potentially more from his gastric ulcer that he was found on endoscopy.  We discussed some of the techniques of advanced polypectomy which include Endoscopic Mucosal Resection, OVESCO Full-Thickness Resection, Endorotor Morcellation, and Tissue Ablation via Fulguration.  The risks and benefits of endoscopic evaluation were discussed with the patient; these include but are not limited to the risk of perforation, infection, bleeding, missed lesions, lack of diagnosis, severe illness requiring hospitalization, as well as anesthesia and sedation related illnesses.  During attempts at advanced polypectomy, the risks of bleeding and perforation are increased as opposed to diagnostic and screening colonoscopies, and that was discussed with the patient as well.  I did offer, a referral to surgery as well to discuss consideration of a hemicolectomy prior to attempt at endoscopic removal, though the patient has deferred on this.  If, after removal of the polyp, it is found that the patient has an invasive lesion or malignant lesion, it could require a surgical referral as well.  The patient and daughter are agreeable to proceed.  We will plan a colonoscopy with EMR at [facility] or [place] in the  next 4 to 6 weeks.  I will plan for his colonoscopy performed for attempt at that polyp resection.   PLAN  1. Tubular adenoma of colon - Proceed with Colonoscopy with attempt at EMR - If unable to perform or evidence of  complication or malignancy will need surgical discussions/referral  2. History of colonic polyps  3. Abnormal colonoscopy   Orders Placed This Encounter  Procedures  . Ambulatory referral to Gastroenterology    New Prescriptions   No medications on file   Modified Medications   No medications on file    Planned Follow Up: No follow-ups on file.   Justice Britain, MD [facility] Advanced Endoscopy Office # [phone]

## 2018-06-15 NOTE — Patient Instructions (Signed)
You have been scheduled for a colonoscopy at [facility] on 07/29/2018 at 7:30am, separate instructions have been given   If you are age 75 or older, your body mass index should be between 23-30. Your Body mass index is 27.98 kg/m. If this is out of the aforementioned range listed, please consider follow up with your Primary Care Provider.  If you are age 10 or younger, your body mass index should be between 19-25. Your Body mass index is 27.98 kg/m. If this is out of the aformentioned range listed, please consider follow up with your Primary Care Provider.

## 2018-07-02 NOTE — Progress Notes (Signed)
Raymond Pope - 77 y.o. male MRN [id number]  Date of birth: 23-Jun-1941  SUBJECTIVE:  Including CC & ROS.  Chief Complaint  Patient presents with  . New Patient (Initial Visit)    Hip and hand pain    Raymond Pope is a 77 y.o. male that is presenting as a new pt w/ c/o R hip/ post thigh and R hand pain.  R post thigh/ post leg pain began about 1.5 months ago w/ no known MOI.  He reports similar pain previously but notes that his symptoms last longer every time it happens.  Pt's R post thigh symptoms are irritated w/ walking, climbing stairs and and w/ prolonged sitting.  Pt reports numbness in his L LE.  Pt reports difficulty staying asleep.  No hx of diagnostic testing for his l-spine or R hip.    R hand pain: Pt reports R hand and shoulder pain x 1.5 months w/ no known MOI.  He has difficulty when trying to raise his arms OH.  He reports intermittent swelling in his R hand.  Denies any N/T in B UEs.  Denies any mechanical symptoms in shoulder.  Pt states that he's been taking prednisone prescribed by Dr. Ethelene Hal.  Pt also reports having received an injection in early Aug. 2019 from Dr. Ethelene Hal.  No diagnostic testing reported for R hip, hand or shoulder.  L-spine XR - 06/01/14   Review of Systems  Constitutional: Negative for fever.  HENT: Negative for congestion.   Respiratory: Negative for cough.   Cardiovascular: Negative for chest pain.  Gastrointestinal: Negative for abdominal pain.  Musculoskeletal: Positive for back pain and neck pain.  Skin: Negative for color change.  Neurological: Negative for weakness.  Hematological: Negative for adenopathy.  Psychiatric/Behavioral: Negative for agitation.    HISTORY: Past Medical, Surgical, Social, and Family History Reviewed & Updated per EMR.   Pertinent Historical Findings include:  Past Medical History:  Diagnosis Date  . Anemia   . Diabetes mellitus without complication ([place])   . HTN (hypertension)   . Peptic ulcer   .  Renal cyst     Past Surgical History:  Procedure Laterality Date  . NO PAST SURGERIES      No Known Allergies  Family History  Problem Relation Age of Onset  . Breast cancer Daughter   . Diabetes Son   . Heart murmur Son   . Colon cancer Neg Hx   . Esophageal cancer Neg Hx   . Inflammatory bowel disease Neg Hx   . Liver disease Neg Hx   . Pancreatic cancer Neg Hx   . Rectal cancer Neg Hx      Social History   Socioeconomic History  . Marital status: Widowed    Spouse name: Not on file  . Number of children: 4  . Years of education: Not on file  . Highest education level: Not on file  Occupational History  . Occupation: retired  Scientific laboratory technician  . Financial resource strain: Not on file  . Food insecurity:    Worry: Not on file    Inability: Not on file  . Transportation needs:    Medical: Not on file    Non-medical: Not on file  Tobacco Use  . Smoking status: Current Every Day Smoker    Types: Pipe  . Smokeless tobacco: Never Used  Substance and Sexual Activity  . Alcohol use: No  . Drug use: No  . Sexual activity: Not on file  Lifestyle  .  Physical activity:    Days per week: Not on file    Minutes per session: Not on file  . Stress: Not on file  Relationships  . Social connections:    Talks on phone: Not on file    Gets together: Not on file    Attends religious service: Not on file    Active member of club or organization: Not on file    Attends meetings of clubs or organizations: Not on file    Relationship status: Not on file  . Intimate partner violence:    Fear of current or ex partner: Not on file    Emotionally abused: Not on file    Physically abused: Not on file    Forced sexual activity: Not on file  Other Topics Concern  . Not on file  Social History Narrative  . Not on file     PHYSICAL EXAM:  VS: BP 112/64 (BP Location: Left Arm, Patient Position: Sitting, Cuff Size: Normal)   Pulse (!) 113   Ht 5\' 10"  (1.778 m)   Wt 197 lb 3.2  oz (89.4 kg)   SpO2 96%   BMI 28.30 kg/m  Physical Exam Gen: NAD, alert, cooperative with exam, well-appearing ENT: normal lips, normal nasal mucosa,  Eye: normal EOM, normal conjunctiva and lids CV:  no edema, +2 pedal pulses   Resp: no accessory muscle use, non-labored,  Skin: no rashes, no areas of induration  Neuro: normal tone, normal sensation to touch Psych:  normal insight, alert and oriented MSK:  Back:  Mild tenderness to palpation of paraspinal muscles bilaterally. No significant tenderness palpation of the midline lumbar spine, SI joint, or piriformis normal internal and external rotation of the hips. Normal strength resistance with hip flexion, knee flexion and extension.  Straight leg raise on the right. Resistance with dorsiflexion plantarflexion. Neck. No tenderness palpation over the paraspinal neck muscles. Normal strength resistance with shrug. Normal shoulder range of motion. Normal empty can testing. No signs of atrophy. Normal grip strength. Neurovascular intact     ASSESSMENT & PLAN:   Lumbar radiculopathy Findings suggestive of radiculopathy -X-rays. -Initiate gabapentin -If no improvement consider trigger point injections or physical therapy.  Cervical radiculopathy Pain in his hand does not appear to be localized to the hand as the source.  Appears to be more radicular in nature.  Shoulders appears to be normal on exam. -Gabapentin. -X-rays. -If no improvement consider physical therapy.

## 2018-07-03 ENCOUNTER — Ambulatory Visit (INDEPENDENT_AMBULATORY_CARE_PROVIDER_SITE_OTHER): Payer: Medicare HMO | Admitting: Family Medicine

## 2018-07-03 ENCOUNTER — Ambulatory Visit (INDEPENDENT_AMBULATORY_CARE_PROVIDER_SITE_OTHER)
Admission: RE | Admit: 2018-07-03 | Discharge: 2018-07-03 | Disposition: A | Discharge status: Home / Self Care | Payer: Medicare HMO | Source: Ambulatory Visit | Attending: Family Medicine | Admitting: Family Medicine

## 2018-07-03 ENCOUNTER — Encounter: Payer: Self-pay | Admitting: Family Medicine

## 2018-07-03 VITALS — BP 112/64 | HR 113 | Ht 70.0 in | Wt 197.2 lb

## 2018-07-03 DIAGNOSIS — M5412 Radiculopathy, cervical region: Secondary | ICD-10-CM

## 2018-07-03 DIAGNOSIS — M5416 Radiculopathy, lumbar region: Secondary | ICD-10-CM

## 2018-07-03 MED ORDER — GABAPENTIN 100 MG PO CAPS: 100.0000 mg | ORAL_CAPSULE | Freq: Three times a day (TID) | ORAL | 3 refills | Status: DC

## 2018-07-03 NOTE — Patient Instructions (Signed)
Good to see you  Please start the gabapentin with one pill at night. You can increase this to two pills daily and up to three pill daily.  Please try the exercises  Please try working on your posture  Please see me back in 3-4 weeks.

## 2018-07-05 DIAGNOSIS — M5412 Radiculopathy, cervical region: Secondary | ICD-10-CM | POA: Insufficient documentation

## 2018-07-05 DIAGNOSIS — M5416 Radiculopathy, lumbar region: Secondary | ICD-10-CM | POA: Insufficient documentation

## 2018-07-05 NOTE — Assessment & Plan Note (Addendum)
Findings suggestive of radiculopathy -X-rays. -Initiate gabapentin -If no improvement consider trigger point injections or physical therapy.

## 2018-07-05 NOTE — Assessment & Plan Note (Signed)
Pain in his hand does not appear to be localized to the hand as the source.  Appears to be more radicular in nature.  Shoulders appears to be normal on exam. -Gabapentin. -X-rays. -If no improvement consider physical therapy.

## 2018-07-06 ENCOUNTER — Telehealth: Payer: Self-pay | Admitting: Family Medicine

## 2018-07-06 ENCOUNTER — Ambulatory Visit (INDEPENDENT_AMBULATORY_CARE_PROVIDER_SITE_OTHER): Payer: Medicare HMO | Admitting: Family Medicine

## 2018-07-06 ENCOUNTER — Encounter: Payer: Self-pay | Admitting: Family Medicine

## 2018-07-06 VITALS — BP 110/60 | HR 66 | Ht 70.0 in | Wt 197.0 lb

## 2018-07-06 DIAGNOSIS — M25512 Pain in left shoulder: Secondary | ICD-10-CM | POA: Diagnosis not present

## 2018-07-06 DIAGNOSIS — Z23 Encounter for immunization: Secondary | ICD-10-CM | POA: Diagnosis not present

## 2018-07-06 DIAGNOSIS — M5416 Radiculopathy, lumbar region: Secondary | ICD-10-CM

## 2018-07-06 DIAGNOSIS — R972 Elevated prostate specific antigen [PSA]: Secondary | ICD-10-CM

## 2018-07-06 DIAGNOSIS — D509 Iron deficiency anemia, unspecified: Secondary | ICD-10-CM | POA: Diagnosis not present

## 2018-07-06 DIAGNOSIS — D123 Benign neoplasm of transverse colon: Secondary | ICD-10-CM

## 2018-07-06 DIAGNOSIS — M25511 Pain in right shoulder: Secondary | ICD-10-CM | POA: Diagnosis not present

## 2018-07-06 LAB — CBC
HEMATOCRIT: 29.5 % — AB (ref 39.0–52.0)
Hemoglobin: 9.2 g/dL — ABNORMAL LOW (ref 13.0–17.0)
MCHC: 31 g/dL (ref 30.0–36.0)
MCV: 73.8 fl — ABNORMAL LOW (ref 78.0–100.0)
Platelets: 448 10*3/uL — ABNORMAL HIGH (ref 150.0–400.0)
RBC: 3.99 Mil/uL — AB (ref 4.22–5.81)
RDW: 17 % — AB (ref 11.5–15.5)
WBC: 12.2 10*3/uL — AB (ref 4.0–10.5)

## 2018-07-06 MED ORDER — KETOROLAC TROMETHAMINE 60 MG/2ML IM SOLN
60.0000 mg | Freq: Once | INTRAMUSCULAR | Status: AC
  Administered 2018-07-06: 60 mg via INTRAMUSCULAR

## 2018-07-06 NOTE — Progress Notes (Addendum)
Subjective:  Patient ID: Raymond Pope, male    DOB: [birth year]  Age: 77 y.o. MRN: [id number]  CC: Follow-up   HPI Raymond Pope presents for follow-up of his microcytic anemia.  He is taking his iron pill twice a day.  He is accompanied by his daughter today.  His back continues to bother him a great deal.  He was given exercises to do by sports medicine.  He is unable to do those on his own.  His daughter would like for him to be seen at [place].  He is scheduled for endoscopic removal of his large transverse colon polyp.  He is told this might may take 2 procedures.  He is scheduled to see urology for his elevated PSA at the end of the month.  Outpatient Medications Prior to Visit  Medication Sig Dispense Refill  . amLODipine (NORVASC) 10 MG tablet Take 1 tablet (10 mg total) by mouth daily. 100 tablet 1  . aspirin EC 81 MG tablet Take 81 mg by mouth daily.    . ferrous sulfate 325 (65 FE) MG tablet Take 1 tablet (325 mg total) by mouth daily with breakfast. 90 tablet 1  . gabapentin (NEURONTIN) 100 MG capsule Take 1 capsule (100 mg total) by mouth 3 (three) times daily. 90 capsule 3  . losartan-hydrochlorothiazide (HYZAAR) 100-12.5 MG tablet Take 1 tablet by mouth daily. 100 tablet 1  . omeprazole (PRILOSEC) 40 MG capsule Take 1 capsule (40 mg total) by mouth 2 (two) times daily before a meal. Take twice per day before meals for 1 month, and then once per day before breakfast until further notice 120 capsule 3  . predniSONE (STERAPRED UNI-PAK 21 TAB) 10 MG (21) TBPK tablet Pharm to instruct 6 day taper(6,5,4,3,2,1) 21 tablet 0  . SitaGLIPtin-MetFORMIN HCl (JANUMET XR) 50-500 MG TB24 Take 1 tablet by mouth 2 (two) times daily. 180 tablet 1  . tamsulosin (FLOMAX) 0.4 MG CAPS capsule Take 1 capsule (0.4 mg total) by mouth 1 day or 1 dose. 100 capsule 1  . traMADol (ULTRAM) 50 MG tablet Take one to two at night as needed 15 tablet 0   No facility-administered medications  prior to visit.     ROS Review of Systems  Constitutional: Negative.   Respiratory: Negative.   Cardiovascular: Negative.   Gastrointestinal: Negative.   Musculoskeletal: Positive for arthralgias, back pain and gait problem.  Hematological: Does not bruise/bleed easily.  Psychiatric/Behavioral: Negative.     Objective:  BP 110/60   Pulse 66   Ht 5\' 10"  (1.778 m)   Wt 197 lb (89.4 kg)   SpO2 99%   BMI 28.27 kg/m   BP Readings from Last 3 Encounters:  07/06/18 110/60  07/03/18 112/64  06/15/18 90/60    Wt Readings from Last 3 Encounters:  07/06/18 197 lb (89.4 kg)  07/03/18 197 lb 3.2 oz (89.4 kg)  06/15/18 195 lb (88.5 kg)    Physical Exam  Constitutional: He is oriented to person, place, and time. He appears well-developed and well-nourished. No distress.  HENT:  Head: Normocephalic and atraumatic.  Right Ear: External ear normal.  Eyes: Right eye exhibits no discharge. Left eye exhibits no discharge. No scleral icterus.  Pulmonary/Chest: Effort normal.  Neurological: He is alert and oriented to person, place, and time.  Skin: He is not diaphoretic.  Psychiatric: He has a normal mood and affect. His behavior is normal.    Lab Results  Component Value Date  WBC 13.3 (H) 06/03/2018   HGB 9.3 (L) 06/03/2018   HCT 29.3 (L) 06/03/2018   PLT 440.0 (H) 06/03/2018   GLUCOSE 111 (H) 01/13/2018   CHOL 136 01/13/2018   TRIG 177.0 (H) 01/13/2018   HDL 25.10 (L) 01/13/2018   LDLCALC 76 01/13/2018   ALT 10 01/13/2018   AST 9 01/13/2018   NA 139 01/13/2018   K 4.3 01/13/2018   CL 104 01/13/2018   CREATININE 1.39 01/13/2018   BUN 25 (H) 01/13/2018   CO2 28 01/13/2018   TSH 1.94 01/13/2018   PSA 6.46 (H) 06/03/2018   HGBA1C 6.4 01/13/2018   MICROALBUR 1.5 01/13/2018    Dg Cervical Spine 2 Or 3 Views  Result Date: 07/03/2018 CLINICAL DATA:  Neck pain rt > lt x 6 wks, NKI. EXAM: CERVICAL SPINE - 2-3 VIEW COMPARISON:  None. FINDINGS: Mild degenerative  spondylitic changes at the C5-6 and C6-7 levels, with associated mild disc space narrowings and mild osseous spurring. Slight retrolisthesis of C5 is related to the underlying degenerative changes. No acute or suspicious osseous finding. Prevertebral soft tissues are normal in thickness. Visualized paravertebral soft tissues are unremarkable. IMPRESSION: 1. No acute findings. 2. Mild degenerative spondylosis within the lower cervical spine, as detailed above. Electronically Signed   By: Franki Cabot M.D.   On: 07/03/2018 15:11   Dg Lumbar Spine 2-3 Views  Result Date: 07/03/2018 CLINICAL DATA:  Lower back AND rt posterior leg pain x 6 wks, EXAM: LUMBAR SPINE - 2-3 VIEW COMPARISON:  None. FINDINGS: Mild degenerative spurring at each level of the lumbar spine. Additional degenerative facet hypertrophy at the L3-4 through L5-S1 levels, mild to moderate in degree. No acute or suspicious osseous finding. Atherosclerotic changes are seen along the walls of the infrarenal abdominal aorta. Visualized paravertebral soft tissues are otherwise unremarkable. IMPRESSION: 1. No acute findings. 2. Degenerative spondylosis of the lumbar spine, mild to moderate in degree, as detailed above. 3.  Aortic Atherosclerosis (ICD10-I70.0). Electronically Signed   By: Franki Cabot M.D.   On: 07/03/2018 15:13    Assessment & Plan:   Raymond Pope was seen today for follow-up.  Diagnoses and all orders for this visit:  Adenomatous polyp of transverse colon  Need for influenza vaccination -     Flu vaccine HIGH DOSE PF  Lumbar radiculopathy -     Ambulatory referral to Orthopedic Surgery -     ketorolac (TORADOL) injection 60 mg  Microcytic anemia -     CBC  Bilateral shoulder pain, unspecified chronicity -     Ambulatory referral to Orthopedic Surgery -     ketorolac (TORADOL) injection 60 mg  Elevated PSA   I am having Raymond Pope maintain his amLODipine, losartan-hydrochlorothiazide, SitaGLIPtin-MetFORMIN HCl,  tamsulosin, aspirin EC, omeprazole, ferrous sulfate, predniSONE, traMADol, and gabapentin. We will continue to administer ketorolac.  Meds ordered this encounter  Medications  . ketorolac (TORADOL) injection 60 mg   Rechecking CBC today.  We will continue iron twice daily.  We will see him in 1 month when he will be status post partial resection of his colon polyp and urology follow-up for his elevated PSA.  Follow-up: Return in about 1 month (around 08/06/2018).  Libby Maw, MD

## 2018-07-06 NOTE — Addendum Note (Signed)
Addended by: Abelino Derrick A on: 07/06/2018 11:12 AM   Modules accepted: Orders

## 2018-07-06 NOTE — Telephone Encounter (Signed)
Spoke with patient about his results.   Rosemarie Ax, MD [facility] 07/06/2018, 9:16 AM

## 2018-07-10 ENCOUNTER — Telehealth: Payer: Self-pay | Admitting: Family Medicine

## 2018-07-10 DIAGNOSIS — M25512 Pain in left shoulder: Principal | ICD-10-CM

## 2018-07-10 DIAGNOSIS — M25511 Pain in right shoulder: Secondary | ICD-10-CM

## 2018-07-10 NOTE — Telephone Encounter (Signed)
Copied from [place] [phone]. Topic: Referral - Question >> Jul 10, 2018  2:27 PM Oliver Pila B wrote: Reason for CRM: Herbert Seta called from [facility] about the referral sent for the pt; they have spoken w/ the daughter and the daughter was under the impression that the pt was going to be seen for PT , shoulders and right leg and hand; [facility] has cancelled the original appt that was made to clarify the referral b/c either way the original has been misplaced and needs to be resent. If it is possible to have all of the above mentioned covered by pt's insurance then have all listed in the referral and place the referral to Dr. Rip Harbour in [place] b/c this physician can handle all of these requests; contact [facility] @ [facility] to advise for any clarification @ [phone] ext [phone]

## 2018-07-10 NOTE — Telephone Encounter (Signed)
The referral has been entered again, can you guys re-send the information if needed? Thanks!

## 2018-07-10 NOTE — Telephone Encounter (Signed)
See message below °

## 2018-07-13 ENCOUNTER — Other Ambulatory Visit: Payer: Self-pay

## 2018-07-13 DIAGNOSIS — N400 Enlarged prostate without lower urinary tract symptoms: Secondary | ICD-10-CM

## 2018-07-13 DIAGNOSIS — I10 Essential (primary) hypertension: Secondary | ICD-10-CM

## 2018-07-13 DIAGNOSIS — E119 Type 2 diabetes mellitus without complications: Secondary | ICD-10-CM

## 2018-07-13 MED ORDER — LOSARTAN POTASSIUM-HCTZ 100-12.5 MG PO TABS: 1.0000 | ORAL_TABLET | Freq: Every day | ORAL | 1 refills | Status: AC

## 2018-07-13 MED ORDER — TAMSULOSIN HCL 0.4 MG PO CAPS: 0.4000 mg | ORAL_CAPSULE | ORAL | 1 refills | Status: AC

## 2018-07-14 ENCOUNTER — Other Ambulatory Visit: Payer: Self-pay

## 2018-07-14 DIAGNOSIS — I10 Essential (primary) hypertension: Secondary | ICD-10-CM

## 2018-07-14 MED ORDER — AMLODIPINE BESYLATE 10 MG PO TABS: 10.0000 mg | ORAL_TABLET | Freq: Every day | ORAL | 1 refills | Status: AC

## 2018-07-16 ENCOUNTER — Other Ambulatory Visit: Payer: Self-pay

## 2018-07-16 NOTE — Telephone Encounter (Signed)
Received a fax from [facility] asking for a refill on patient's gabapentin that Dr. Raeford Razor prescribed.

## 2018-07-20 ENCOUNTER — Other Ambulatory Visit: Payer: Self-pay | Admitting: Family Medicine

## 2018-07-20 ENCOUNTER — Telehealth: Payer: Self-pay | Admitting: Family Medicine

## 2018-07-20 DIAGNOSIS — M79642 Pain in left hand: Secondary | ICD-10-CM

## 2018-07-20 DIAGNOSIS — M25511 Pain in right shoulder: Secondary | ICD-10-CM

## 2018-07-20 DIAGNOSIS — D509 Iron deficiency anemia, unspecified: Secondary | ICD-10-CM

## 2018-07-20 DIAGNOSIS — M25512 Pain in left shoulder: Principal | ICD-10-CM

## 2018-07-20 DIAGNOSIS — M79641 Pain in right hand: Secondary | ICD-10-CM

## 2018-07-20 DIAGNOSIS — M79604 Pain in right leg: Secondary | ICD-10-CM

## 2018-07-20 DIAGNOSIS — M79605 Pain in left leg: Secondary | ICD-10-CM

## 2018-07-20 NOTE — Telephone Encounter (Signed)
Copied from [place] [phone]. Topic: Referral - Request for Referral >> Jul 20, 2018  5:00 PM Cecelia Byars, NT wrote: Has patient seen PCP for this complaint? yes  *If NO, is insurance requiring patient see PCP for this issue before PCP can refer them? Referral for which specialty: Ortho   Preferred provider/office:[facility] ,  Reason for referral:Patients daughter states that the patient needs a new referral for both  shoulders  both legs ,both hands  hs has an appointment on 07/22/18 at 2:15 pm pending  the referral , please call her at  [phone]

## 2018-07-20 NOTE — Telephone Encounter (Signed)
Copied from [place] [id number]. Topic: Quick Communication - Rx Refill/Question >> Jul 20, 2018  4:55 PM Lysle Morales L, NT wrote: Medication: ferrous sulfate 325 (65 FE) MG tablet , patients daughter says he was called and told to take the medication 3 x a day , the pharmacy does have new prescriptions  directions   Has the patient contacted their pharmacy? yes  (Agent: If no, request that the patient contact the pharmacy for the refill. (Agent: If yes, when and what did the pharmacy advise?  Preferred Pharmacy (with phone number or street name [facility] [phone] [address], [place] [phone] (Phone) [phone] (Fax)    Agent: Please be advised that RX refills may take up to 3 business days. We ask that you follow-up with your pharmacy.

## 2018-07-20 NOTE — Telephone Encounter (Signed)
Daughter is requesting refill of Ferrous Sulfate.  Per daughter she was instructed to increase to 3 x day. / OV on 06-12-18 mentions increasing to BID.  I do not see any other TE that reference increasing this medication / I will route to provider for approval /

## 2018-07-21 MED ORDER — FERROUS SULFATE 325 (65 FE) MG PO TABS: 325.0000 mg | ORAL_TABLET | Freq: Every day | ORAL | 1 refills | Status: DC

## 2018-07-21 NOTE — Telephone Encounter (Signed)
Referral has been re-entered & patient's daughter is aware. Please re-send if needed before his appointment tomorrow, thanks!

## 2018-07-21 NOTE — Telephone Encounter (Signed)
Yes

## 2018-07-21 NOTE — Telephone Encounter (Signed)
Raymond Pope from [facility] called back to check status of this medication- stating that the fax had this medication and also omeprazole (PRILOSEC) 40 MG capsule.   CB# [phone]

## 2018-07-22 MED ORDER — FERROUS SULFATE 325 (65 FE) MG PO TABS: 325.0000 mg | ORAL_TABLET | Freq: Two times a day (BID) | ORAL | 1 refills | Status: AC

## 2018-07-22 MED ORDER — GABAPENTIN 100 MG PO CAPS: 100.0000 mg | ORAL_CAPSULE | Freq: Three times a day (TID) | ORAL | 1 refills | Status: AC

## 2018-07-22 MED ORDER — OMEPRAZOLE 40 MG PO CPDR: 40.0000 mg | DELAYED_RELEASE_CAPSULE | Freq: Every day | ORAL | 1 refills | Status: AC

## 2018-07-22 NOTE — Addendum Note (Signed)
Addended by: Kateri Mc E on: 07/22/2018 08:06 AM   Modules accepted: Orders

## 2018-07-22 NOTE — Addendum Note (Signed)
Addended by: Kateri Mc E on: 07/22/2018 08:05 AM   Modules accepted: Orders

## 2018-07-22 NOTE — Telephone Encounter (Signed)
Per note patient should be taking iron bid, new rx sent.

## 2018-07-23 ENCOUNTER — Telehealth: Payer: Self-pay | Admitting: Gastroenterology

## 2018-07-23 NOTE — Telephone Encounter (Signed)
The instructions for the procedure with the date and time have been left at the front desk for pick up.

## 2018-07-27 ENCOUNTER — Other Ambulatory Visit: Payer: Self-pay

## 2018-07-27 ENCOUNTER — Other Ambulatory Visit: Payer: Self-pay | Admitting: Gastroenterology

## 2018-07-27 ENCOUNTER — Encounter (HOSPITAL_COMMUNITY): Payer: Self-pay | Admitting: *Deleted

## 2018-07-27 ENCOUNTER — Telehealth: Payer: Self-pay | Admitting: Gastroenterology

## 2018-07-27 MED ORDER — PEG 3350-KCL-NA BICARB-NACL 420 G PO SOLR: 4000.0000 mL | ORAL | 0 refills | Status: AC

## 2018-07-27 NOTE — Telephone Encounter (Signed)
Spoke to daughter to let her know prescription has been sent to the pharmacy

## 2018-07-27 NOTE — Telephone Encounter (Signed)
Pls send prescription [facility] prep to [facility] in [place]. Pharmacy states that they never receive a prescription, pt's procedure is on 07/29/18.

## 2018-07-28 ENCOUNTER — Encounter (HOSPITAL_COMMUNITY): Payer: Self-pay | Admitting: Certified Registered Nurse Anesthetist

## 2018-07-29 ENCOUNTER — Encounter (HOSPITAL_COMMUNITY)
Admission: RE | Disposition: A | Discharge status: Home / Self Care | Payer: Self-pay | Source: Ambulatory Visit | Attending: Gastroenterology

## 2018-07-29 ENCOUNTER — Ambulatory Visit (HOSPITAL_COMMUNITY)
Admission: RE | Admit: 2018-07-29 | Discharge: 2018-07-29 | Disposition: A | Discharge status: Home / Self Care | Payer: Medicare HMO | Source: Ambulatory Visit | Attending: Gastroenterology | Admitting: Gastroenterology

## 2018-07-29 ENCOUNTER — Ambulatory Visit (HOSPITAL_COMMUNITY): Payer: Medicare HMO | Admitting: Certified Registered Nurse Anesthetist

## 2018-07-29 ENCOUNTER — Encounter (HOSPITAL_COMMUNITY): Payer: Self-pay | Admitting: *Deleted

## 2018-07-29 ENCOUNTER — Other Ambulatory Visit: Payer: Self-pay

## 2018-07-29 DIAGNOSIS — K635 Polyp of colon: Secondary | ICD-10-CM | POA: Diagnosis present

## 2018-07-29 DIAGNOSIS — Z86010 Personal history of colonic polyps: Secondary | ICD-10-CM

## 2018-07-29 DIAGNOSIS — Z79899 Other long term (current) drug therapy: Secondary | ICD-10-CM | POA: Diagnosis not present

## 2018-07-29 DIAGNOSIS — D123 Benign neoplasm of transverse colon: Secondary | ICD-10-CM | POA: Diagnosis not present

## 2018-07-29 DIAGNOSIS — I10 Essential (primary) hypertension: Secondary | ICD-10-CM | POA: Insufficient documentation

## 2018-07-29 DIAGNOSIS — E119 Type 2 diabetes mellitus without complications: Secondary | ICD-10-CM | POA: Diagnosis not present

## 2018-07-29 DIAGNOSIS — F17290 Nicotine dependence, other tobacco product, uncomplicated: Secondary | ICD-10-CM | POA: Diagnosis not present

## 2018-07-29 DIAGNOSIS — D122 Benign neoplasm of ascending colon: Secondary | ICD-10-CM | POA: Insufficient documentation

## 2018-07-29 HISTORY — PX: COLONOSCOPY WITH PROPOFOL: SHX5780

## 2018-07-29 HISTORY — PX: POLYPECTOMY: SHX5525

## 2018-07-29 HISTORY — PX: SUBMUCOSAL INJECTION: SHX5543

## 2018-07-29 LAB — GLUCOSE, CAPILLARY: GLUCOSE-CAPILLARY: 100 mg/dL — AB (ref 70–99)

## 2018-07-29 SURGERY — COLONOSCOPY WITH PROPOFOL
Anesthesia: Monitor Anesthesia Care

## 2018-07-29 MED ORDER — ONDANSETRON HCL 4 MG/2ML IJ SOLN
INTRAMUSCULAR | Status: DC | PRN
  Administered 2018-07-29: 4 mg via INTRAVENOUS

## 2018-07-29 MED ORDER — SODIUM CHLORIDE 0.9 % IJ SOLN
PREFILLED_SYRINGE | INTRAMUSCULAR | Status: DC | PRN
  Administered 2018-07-29: 2.5 mL

## 2018-07-29 MED ORDER — PROPOFOL 10 MG/ML IV BOLUS
INTRAVENOUS | Status: AC
  Filled 2018-07-29: qty 80

## 2018-07-29 MED ORDER — LACTATED RINGERS IV SOLN: INTRAVENOUS | Status: DC

## 2018-07-29 MED ORDER — LIDOCAINE 2% (20 MG/ML) 5 ML SYRINGE
INTRAMUSCULAR | Status: DC | PRN
  Administered 2018-07-29: 80 mg via INTRAVENOUS

## 2018-07-29 MED ORDER — LACTATED RINGERS IV SOLN
INTRAVENOUS | Status: AC | PRN
  Administered 2018-07-29: 09:00:00 via INTRAVENOUS
  Administered 2018-07-29: 1000 mL via INTRAVENOUS

## 2018-07-29 MED ORDER — PROPOFOL 500 MG/50ML IV EMUL
INTRAVENOUS | Status: DC | PRN
  Administered 2018-07-29: 125 ug/kg/min via INTRAVENOUS

## 2018-07-29 MED ORDER — EPINEPHRINE PF 1 MG/10ML IJ SOSY
PREFILLED_SYRINGE | INTRAMUSCULAR | Status: AC
  Filled 2018-07-29: qty 10

## 2018-07-29 MED ORDER — PROPOFOL 10 MG/ML IV BOLUS
INTRAVENOUS | Status: DC | PRN
  Administered 2018-07-29: 30 mg via INTRAVENOUS
  Administered 2018-07-29: 20 mg via INTRAVENOUS

## 2018-07-29 MED ORDER — SODIUM CHLORIDE 0.9 % IJ SOLN
PREFILLED_SYRINGE | INTRAMUSCULAR | Status: DC | PRN
  Administered 2018-07-29: 4.5 mL

## 2018-07-29 MED ORDER — SODIUM CHLORIDE 0.9 % IV SOLN: INTRAVENOUS | Status: DC

## 2018-07-29 MED ORDER — SPOT INK MARKER SYRINGE KIT
PACK | SUBMUCOSAL | Status: AC
  Filled 2018-07-29: qty 5

## 2018-07-29 SURGICAL SUPPLY — 22 items

## 2018-07-29 NOTE — Discharge Instructions (Signed)

## 2018-07-29 NOTE — H&P (Signed)
GASTROENTEROLOGY OUTPATIENT PROCEDURE H&P NOTE   Primary Care Physician: Libby Maw, MD  HPI: Raymond Pope is a 77 y.o. male who presents for Colonoscopy with EMR attempt.  Past Medical History:  Diagnosis Date  . Anemia   . Diabetes mellitus without complication ([place])   . HTN (hypertension)   . Peptic ulcer   . Renal cyst    Past Surgical History:  Procedure Laterality Date  . NO PAST SURGERIES     Current Facility-Administered Medications  Medication Dose Route Frequency Provider Last Rate Last Dose  . lactated ringers infusion   Intravenous Continuous Mansouraty, Telford Nab., MD      . lactated ringers infusion    Continuous PRN Mansouraty, Telford Nab., MD       No Known Allergies Family History  Problem Relation Age of Onset  . Breast cancer Daughter   . Diabetes Son   . Heart murmur Son   . Colon cancer Neg Hx   . Esophageal cancer Neg Hx   . Inflammatory bowel disease Neg Hx   . Liver disease Neg Hx   . Pancreatic cancer Neg Hx   . Rectal cancer Neg Hx    Social History   Socioeconomic History  . Marital status: Widowed    Spouse name: Not on file  . Number of children: 4  . Years of education: Not on file  . Highest education level: Not on file  Occupational History  . Occupation: retired  Scientific laboratory technician  . Financial resource strain: Not on file  . Food insecurity:    Worry: Not on file    Inability: Not on file  . Transportation needs:    Medical: Not on file    Non-medical: Not on file  Tobacco Use  . Smoking status: Current Some Day Smoker    Types: Pipe  . Smokeless tobacco: Never Used  Substance and Sexual Activity  . Alcohol use: No  . Drug use: No  . Sexual activity: Not on file  Lifestyle  . Physical activity:    Days per week: Not on file    Minutes per session: Not on file  . Stress: Not on file  Relationships  . Social connections:    Talks on phone: Not on file    Gets together: Not on file    Attends  religious service: Not on file    Active member of club or organization: Not on file    Attends meetings of clubs or organizations: Not on file    Relationship status: Not on file  . Intimate partner violence:    Fear of current or ex partner: Not on file    Emotionally abused: Not on file    Physically abused: Not on file    Forced sexual activity: Not on file  Other Topics Concern  . Not on file  Social History Narrative  . Not on file    Physical Exam: Vital signs in last 24 hours: Temp:  [98.4 F (36.9 C)] 98.4 F (36.9 C) (10/30 0645) Pulse Rate:  [99] 99 (10/30 0645) Resp:  [12] 12 (10/30 0645) BP: (127)/(78) 127/78 (10/30 0645) SpO2:  [97 %] 97 % (10/30 0645) Weight:  [89.4 kg] 89.4 kg (10/30 0645)   GEN: NAD EYE: Sclerae anicteric ENT: MMM CV: RR without R/Gs  RESP: CTAB posteriorly GI: Soft, NT/ND NEURO:  Alert & Oriented x 3  Lab Results: No results for input(s): WBC, HGB, HCT, PLT in the last 72  hours. BMET No results for input(s): NA, K, CL, CO2, GLUCOSE, BUN, CREATININE, CALCIUM in the last 72 hours. LFT No results for input(s): PROT, ALBUMIN, AST, ALT, ALKPHOS, BILITOT, BILIDIR, IBILI in the last 72 hours. PT/INR No results for input(s): LABPROT, INR in the last 72 hours.   Impression / Plan: This is a 77 y.o.male who presents for Colonoscopy with EMR attempt.  The risks and benefits of endoscopic evaluation were discussed with the patient; these include but are not limited to the risk of perforation, infection, bleeding, missed lesions, lack of diagnosis, severe illness requiring hospitalization, as well as anesthesia and sedation related illnesses.  The patient is agreeable to proceed.    Justice Britain, MD [facility]oscopy Office # [phone]

## 2018-07-29 NOTE — Transfer of Care (Signed)
Immediate Anesthesia Transfer of Care Note  Patient: Raymond Pope  Procedure(s) Performed: COLONOSCOPY WITH PROPOFOL (N/A ) ENDOSCOPIC MUCOSAL RESECTION (N/A ) SUBMUCOSAL INJECTION HEMOSTASIS CONTROL POLYPECTOMY FOREIGN BODY REMOVAL  Patient Location: Endoscopy Unit  Anesthesia Type:MAC  Level of Consciousness: awake, alert , oriented and patient cooperative  Airway & Oxygen Therapy: Patient Spontanous Breathing and Patient connected to face mask  Post-op Assessment: Report given to RN and Post -op Vital signs reviewed and stable  Post vital signs: Reviewed and stable  Last Vitals:  Vitals Value Taken Time  BP    Temp    Pulse    Resp    SpO2      Last Pain:  Vitals:   07/29/18 0645  TempSrc: Oral  PainSc: 0-No pain         Complications: No apparent anesthesia complications

## 2018-07-29 NOTE — Op Note (Signed)
[facility] Patient Name: Raymond Pope Procedure Date: 07/29/2018 MRN: [id number] Attending MD: Justice Britain , MD Date of Birth: 04-02-1941 CSN: [id number] Age: 77 Admit Type: Outpatient Procedure:                Colonoscopy Indications:              Excision of colonic polyp, Therapeutic procedure,                            Therapeutic procedure for known colon polyp Providers:                Justice Britain, MD, Carlyn Reichert, RN, Tinnie Gens, Technician Referring MD:             Carlota Raspberry. Havery Moros, MD Medicines:                Monitored Anesthesia Care Complications:            No immediate complications. Estimated Blood Loss:     Estimated blood loss was minimal. Procedure:                Pre-Anesthesia Assessment:                           - Prior to the procedure, a History and Physical                            was performed, and patient medications and                            allergies were reviewed. The patient's tolerance of                            previous anesthesia was also reviewed. The risks                            and benefits of the procedure and the sedation                            options and risks were discussed with the patient.                            All questions were answered, and informed consent                            was obtained. Prior Anticoagulants: The patient has                            taken aspirin. ASA Grade Assessment: III - A                            patient with severe systemic disease. After  reviewing the risks and benefits, the patient was                            deemed in satisfactory condition to undergo the                            procedure.                           After obtaining informed consent, the colonoscope                            was passed under direct vision. Throughout the                            procedure,  the patient's blood pressure, pulse, and                            oxygen saturations were monitored continuously. The                            CF-HQ190L ([id number]) Olympus adult colonoscope was                            introduced through the anus and advanced to the the                            cecum, identified by appendiceal orifice and                            ileocecal valve. The colonoscopy was somewhat                            difficult. The patient tolerated the procedure. The                            quality of the bowel preparation was evaluated                            using the BBPS [facility] Bowel Preparation Scale)                            with scores of: Right Colon = 2 (minor amount of                            residual staining, small fragments of stool and/or                            opaque liquid, but mucosa seen well), Transverse                            Colon = 3 (entire mucosa seen well with no residual  staining, small fragments of stool or opaque                            liquid) and Left Colon = 3 (entire mucosa seen well                            with no residual staining, small fragments of stool                            or opaque liquid). The total BBPS score equals 8.                            The quality of the bowel preparation was good. Scope In: 7:41:33 AM Scope Out: 8:44:44 AM Scope Withdrawal Time: 0 hours 58 minutes 30 seconds  Total Procedure Duration: 1 hour 3 minutes 11 seconds  Findings:      Hemorrhoids were found on perianal exam.      A tattoo was seen in the descending colon (this is distal to the lesion       noted below).      At least a 60-70 mm polyp was found in the splenic flexure. The polyp       was semi-sessile. Preparations were made for mucosal resection. [place]       Orise Gel & 1:10000 Epinephrine was injected to raise the lesion.       Piecemeal mucosal resection using a snare was  performed. Resection and       retrieval were complete. Coagulation for tissue destruction using snare       was successful. To prevent bleeding after mucosal resection, three       hemostatic clips were successfully placed (MR conditional). There was no       bleeding at the end of the procedure.      A 26 mm polyp was found in the splenic flexure. The polyp was       pedunculated. Preparations were made for mucosal resection. A 1:10,000       solution of epinephrine was injected to raise the lesion. Snare mucosal       resection was performed. Resection and retrieval were complete.      Seven sessile polyps were found in the splenic flexure (2), transverse       colon (1), hepatic flexure (1) and ascending colon (3). The polyps were       2 to 9 mm in size. These polyps were removed with a cold snare.       Resection and retrieval were complete.      The rest of the colon was not visualized in its entirety as it was       recently performed and due to the intent and time that this procedure       took for completion. Impression:               - Hemorrhoids found on perianal exam.                           - One greater than 60-70 mm polyp at the splenic  flexure, removed with piecemeal mucosal resection.                            Resected and retrieved. Treated with a hot snare to                            edge. Clips (MR conditional) were placed.                           - One 26 mm polyp at the splenic flexure, removed                            with mucosal resection. Resected and retrieved.                           - Seven 2 to 9 mm polyps at the splenic flexure, in                            the transverse colon, at the hepatic flexure and in                            the ascending colon, removed with a cold snare.                            Resected and retrieved.                           - A tattoo was seen in the descending colon distal                             to the large polyp noted above.                           - The rest of the colon was not visualized in its                            entirety due to the intent of the procedure. Moderate Sedation:      N/A- Per Anesthesia Care Recommendation:           - The patient will be observed post-procedure,                            until all discharge criteria are met.                           - Discharge patient to home.                           - Patient has a contact number available for                            emergencies. The signs and symptoms of potential  delayed complications were discussed with the                            patient. Return to normal activities tomorrow.                            Written discharge instructions were provided to the                            patient.                           - Await pathology results.                           - If possible to hold Aspirin for next 7-days that                            would be ideal and no NSAIDs of any sorts for at                            least 1-week, to decrease risk of post-mucosectomy                            bleeding.                           - Repeat colonoscopy in 4-6 months based on                            piecemeal resection and to evaluate the region for                            adenomatous polyp recurrence or any other polyps                            that remain in the colon.                           - The findings and recommendations were discussed                            with the patient.                           - The findings and recommendations were discussed                            with the patient's family. Procedure Code(s):        --- Professional ---                           [id number], 66, Colonoscopy, flexible; with endoscopic  mucosal resection                           [id number], Colonoscopy, flexible;  with removal of                            tumor(s), polyp(s), or other lesion(s) by snare                            technique Diagnosis Code(s):        --- Professional ---                           D12.3, Benign neoplasm of transverse colon (hepatic                            flexure or splenic flexure)                           D12.2, Benign neoplasm of ascending colon                           K64.9, Unspecified hemorrhoids                           K63.5, Polyp of colon CPT copyright 2018 American Medical Association. All rights reserved. The codes documented in this report are preliminary and upon coder review may  be revised to meet current compliance requirements. Justice Britain, MD 07/29/2018 9:17:34 AM Number of Addenda: 0

## 2018-07-29 NOTE — Anesthesia Preprocedure Evaluation (Signed)
Anesthesia Evaluation  Patient identified by MRN, date of birth, ID band Patient awake    Reviewed: Allergy & Precautions, NPO status , Patient's Chart, lab work & pertinent test results  History of Anesthesia Complications Negative for: history of anesthetic complications  Airway Mallampati: II  TM Distance: >3 FB Neck ROM: Full    Dental  (+) Upper Dentures, Lower Dentures   Pulmonary Current Smoker,    breath sounds clear to auscultation       Cardiovascular hypertension, Pt. on medications  Rhythm:Regular  Left ventricle: The cavity size was normal. Wall thickness was increased in a pattern of severe LVH. There was focal basal hypertrophy. Systolic function was normal. The estimated ejection fraction was in the range of 60% to 65%. Wall motion was normal; there were no regional wall motion abnormalities. There was an increased relative contribution of atrial contraction to ventricular filling.   Neuro/Psych  Neuromuscular disease negative psych ROS   GI/Hepatic PUD, GERD  Medicated and Controlled,  Endo/Other  diabetes, Type 2  Renal/GU CRFRenal disease     Musculoskeletal negative musculoskeletal ROS (+)   Abdominal   Peds  Hematology  (+) anemia ,   Anesthesia Other Findings   Reproductive/Obstetrics                             Anesthesia Physical Anesthesia Plan  ASA: III  Anesthesia Plan: MAC   Post-op Pain Management:    Induction:   PONV Risk Score and Plan: 0 and Treatment may vary due to age or medical condition  Airway Management Planned: Nasal Cannula  Additional Equipment: None  Intra-op Plan:   Post-operative Plan:   Informed Consent: I have reviewed the patients History and Physical, chart, labs and discussed the procedure including the risks, benefits and alternatives for the proposed anesthesia with the patient or authorized representative  who has indicated his/her understanding and acceptance.   Dental advisory given  Plan Discussed with: CRNA and Surgeon  Anesthesia Plan Comments:         Anesthesia Quick Evaluation

## 2018-07-30 ENCOUNTER — Encounter (HOSPITAL_COMMUNITY): Payer: Self-pay | Admitting: Gastroenterology

## 2018-08-06 ENCOUNTER — Encounter: Payer: Self-pay | Admitting: Gastroenterology

## 2018-08-11 NOTE — Anesthesia Postprocedure Evaluation (Signed)
Anesthesia Post Note  Patient: Raymond Pope  Procedure(s) Performed: COLONOSCOPY WITH PROPOFOL (N/A ) ENDOSCOPIC MUCOSAL RESECTION (N/A ) SUBMUCOSAL INJECTION HEMOSTASIS CONTROL POLYPECTOMY     Patient location during evaluation: Endoscopy Anesthesia Type: MAC Level of consciousness: awake and alert Pain management: pain level controlled Vital Signs Assessment: post-procedure vital signs reviewed and stable Respiratory status: spontaneous breathing, nonlabored ventilation, respiratory function stable and patient connected to nasal cannula oxygen Cardiovascular status: stable and blood pressure returned to baseline Postop Assessment: no apparent nausea or vomiting Anesthetic complications: no    Last Vitals:  Vitals:   07/29/18 0910 07/29/18 0920  BP: 107/63 127/76  Pulse: 84 89  Resp: 17 19  Temp:    SpO2: 94% 94%    Last Pain:  Vitals:   07/30/18 1528  TempSrc:   PainSc: 0-No pain                 Giorgi Debruin

## 2018-09-02 ENCOUNTER — Ambulatory Visit: Payer: Self-pay | Admitting: *Deleted

## 2018-09-02 NOTE — Telephone Encounter (Addendum)
Raymond Pope, from [employer] calling to report a pt's death. Raymond Pope states that the pt was at home and  was found unresponsive by family at 16:50 pm. 26 min of ACLS level  CPR, 5 Epis IV  and secure airway with ET tube. Not enough criteria to deem futile. No rigor mortis noted. First and last monitor tracings were asystole. Termination of reusitation protocol used. Code called at 17:20 pm. EMS calling to confirm death certificate will be signed by provider. [employer] will be used as Education administrator.  Main emergency [phone]  can be contacted for more information. On call provider, Dr. Ronnald Ramp contacted x 3, but no answer. Left message for proider to return call to office.   Dr. Ronnald Ramp returned call to the office at 18:46 pm. EMS no longer on the phone.Dr. Ronnald Ramp notified of pt's death and states the providers always sign the death certificate. Reason for Disposition . Notification of death  Protocols used: PCP CALL - NO TRIAGE-A-AH

## 2018-09-04 ENCOUNTER — Telehealth: Payer: Self-pay | Admitting: Emergency Medicine

## 2018-09-04 NOTE — Telephone Encounter (Signed)
Death certificate has been signed and is ready for pick up. [facility] service is aware.

## 2018-09-08 NOTE — Telephone Encounter (Signed)
[facility] states they will be by tomorrow to pick up death certificate.

## 2018-09-15 ENCOUNTER — Ambulatory Visit: Payer: Medicare HMO | Admitting: Family Medicine

## 2018-09-30 DEATH — deceased

## 2019-06-01 IMAGING — DX DG LUMBAR SPINE 2-3V
3 series · 3 of 3 positions shown · non-contrast
Comparison: None.

CLINICAL DATA: Lower back AND rt posterior leg pain x 6 wks,

EXAM:
LUMBAR SPINE - 2-3 VIEW

[l-spine ap]
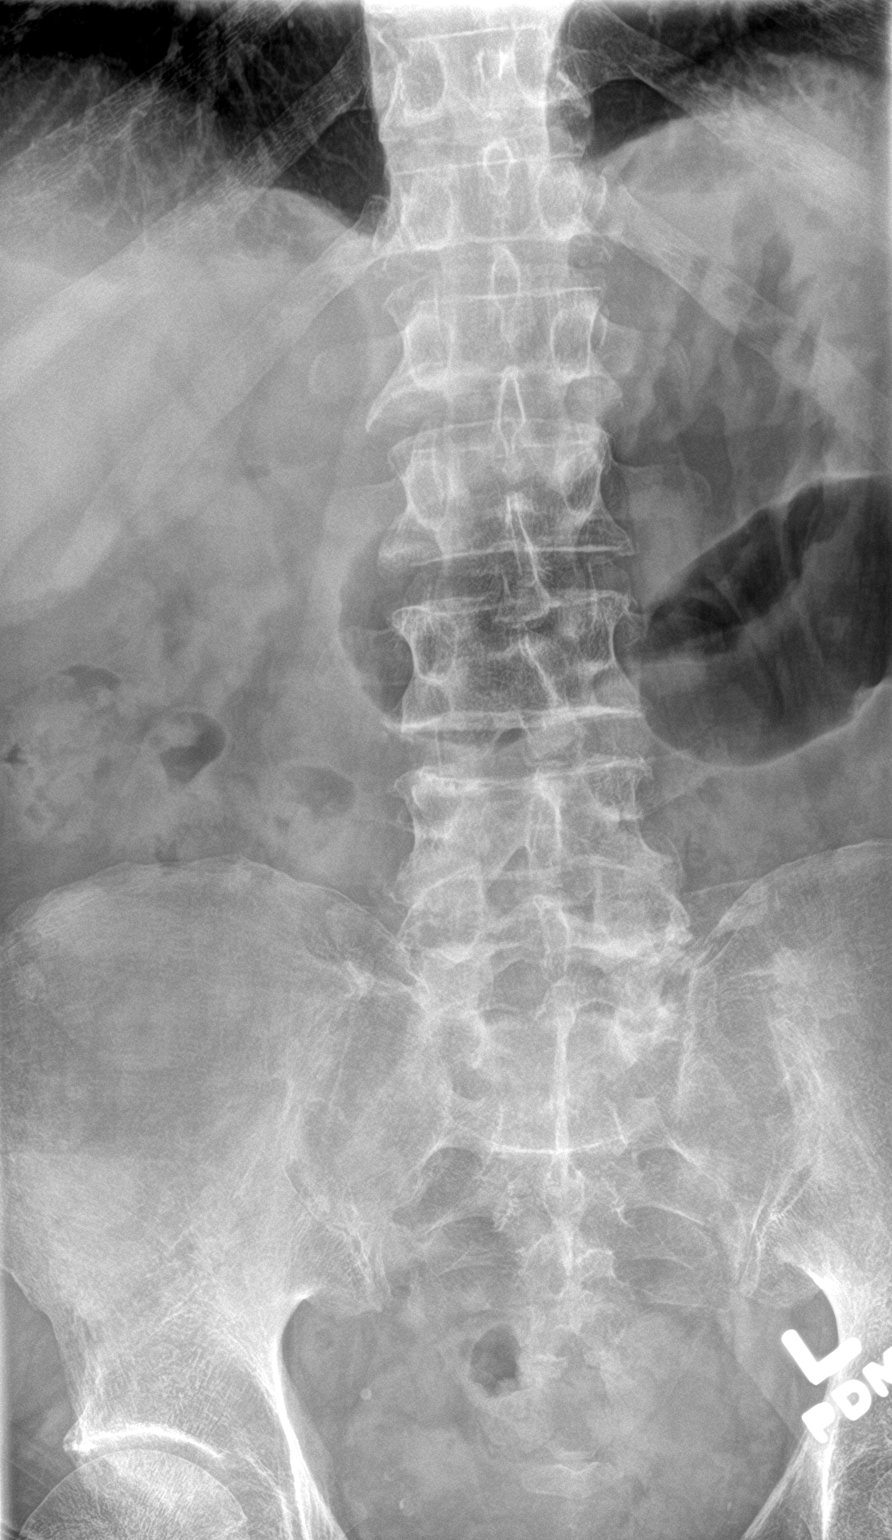

[l-spine lat]
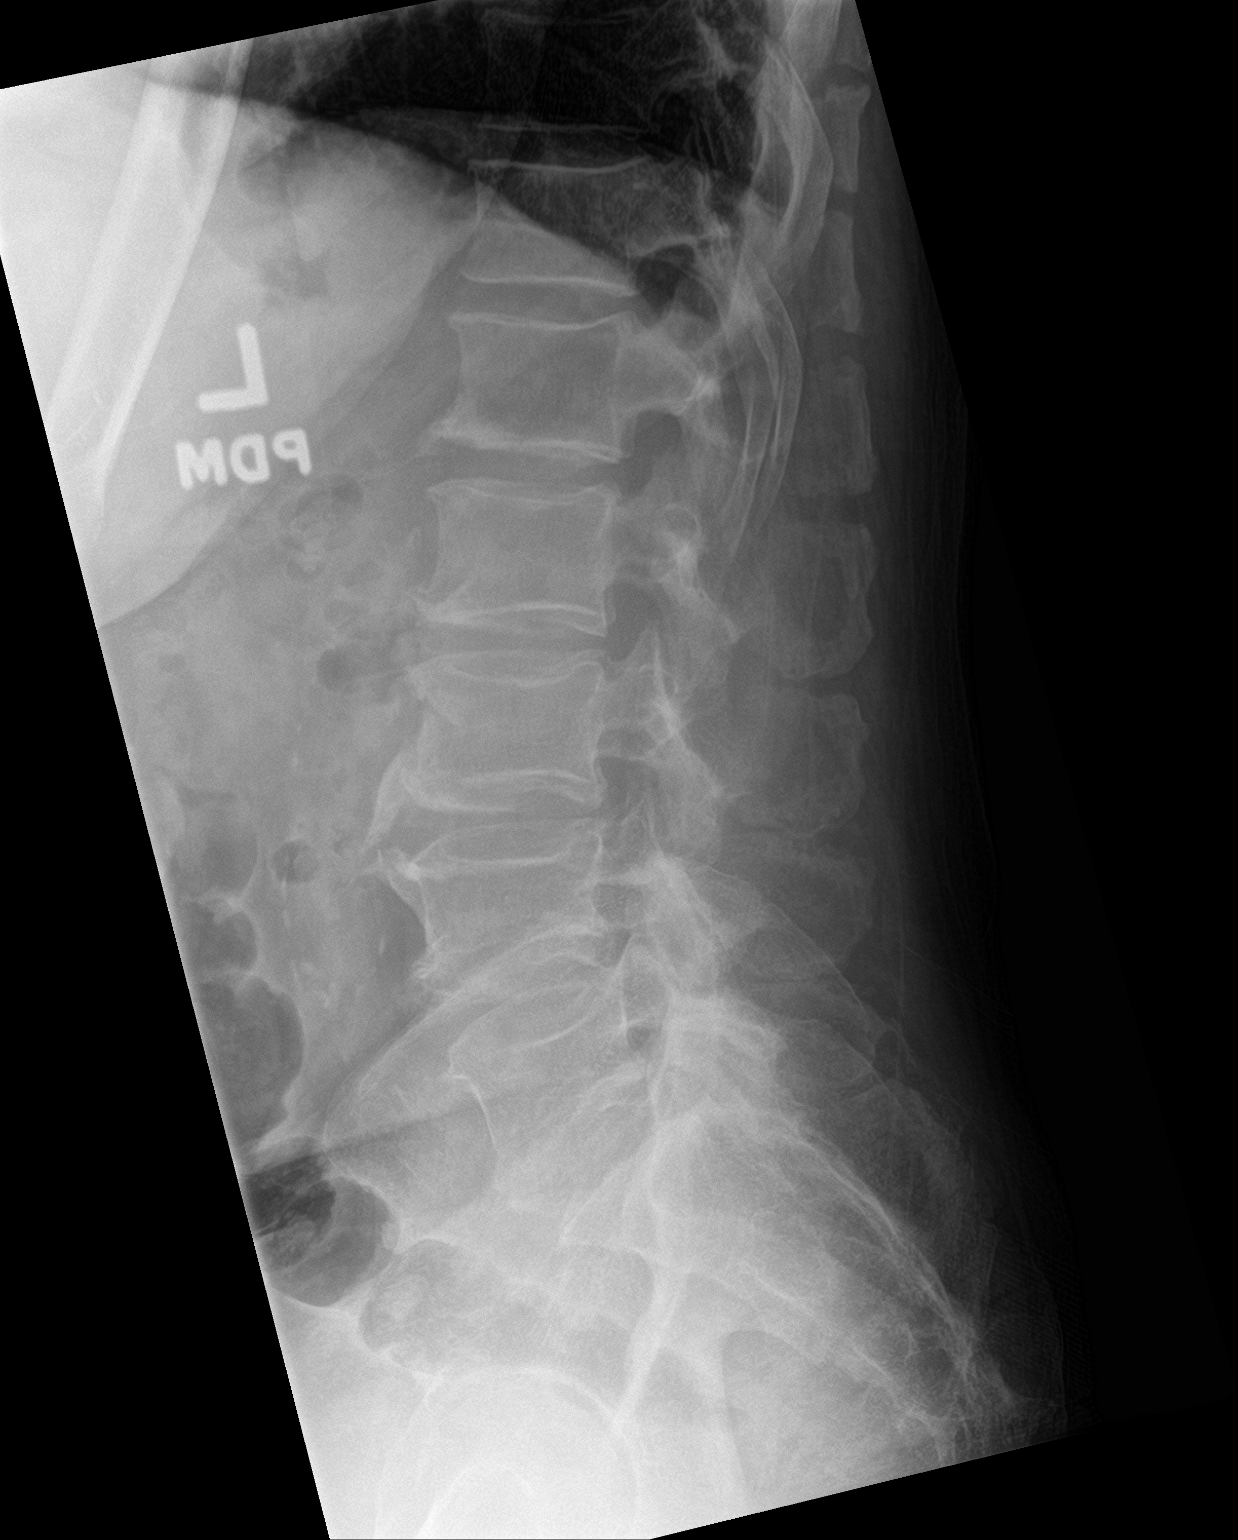

[l-spine spot]
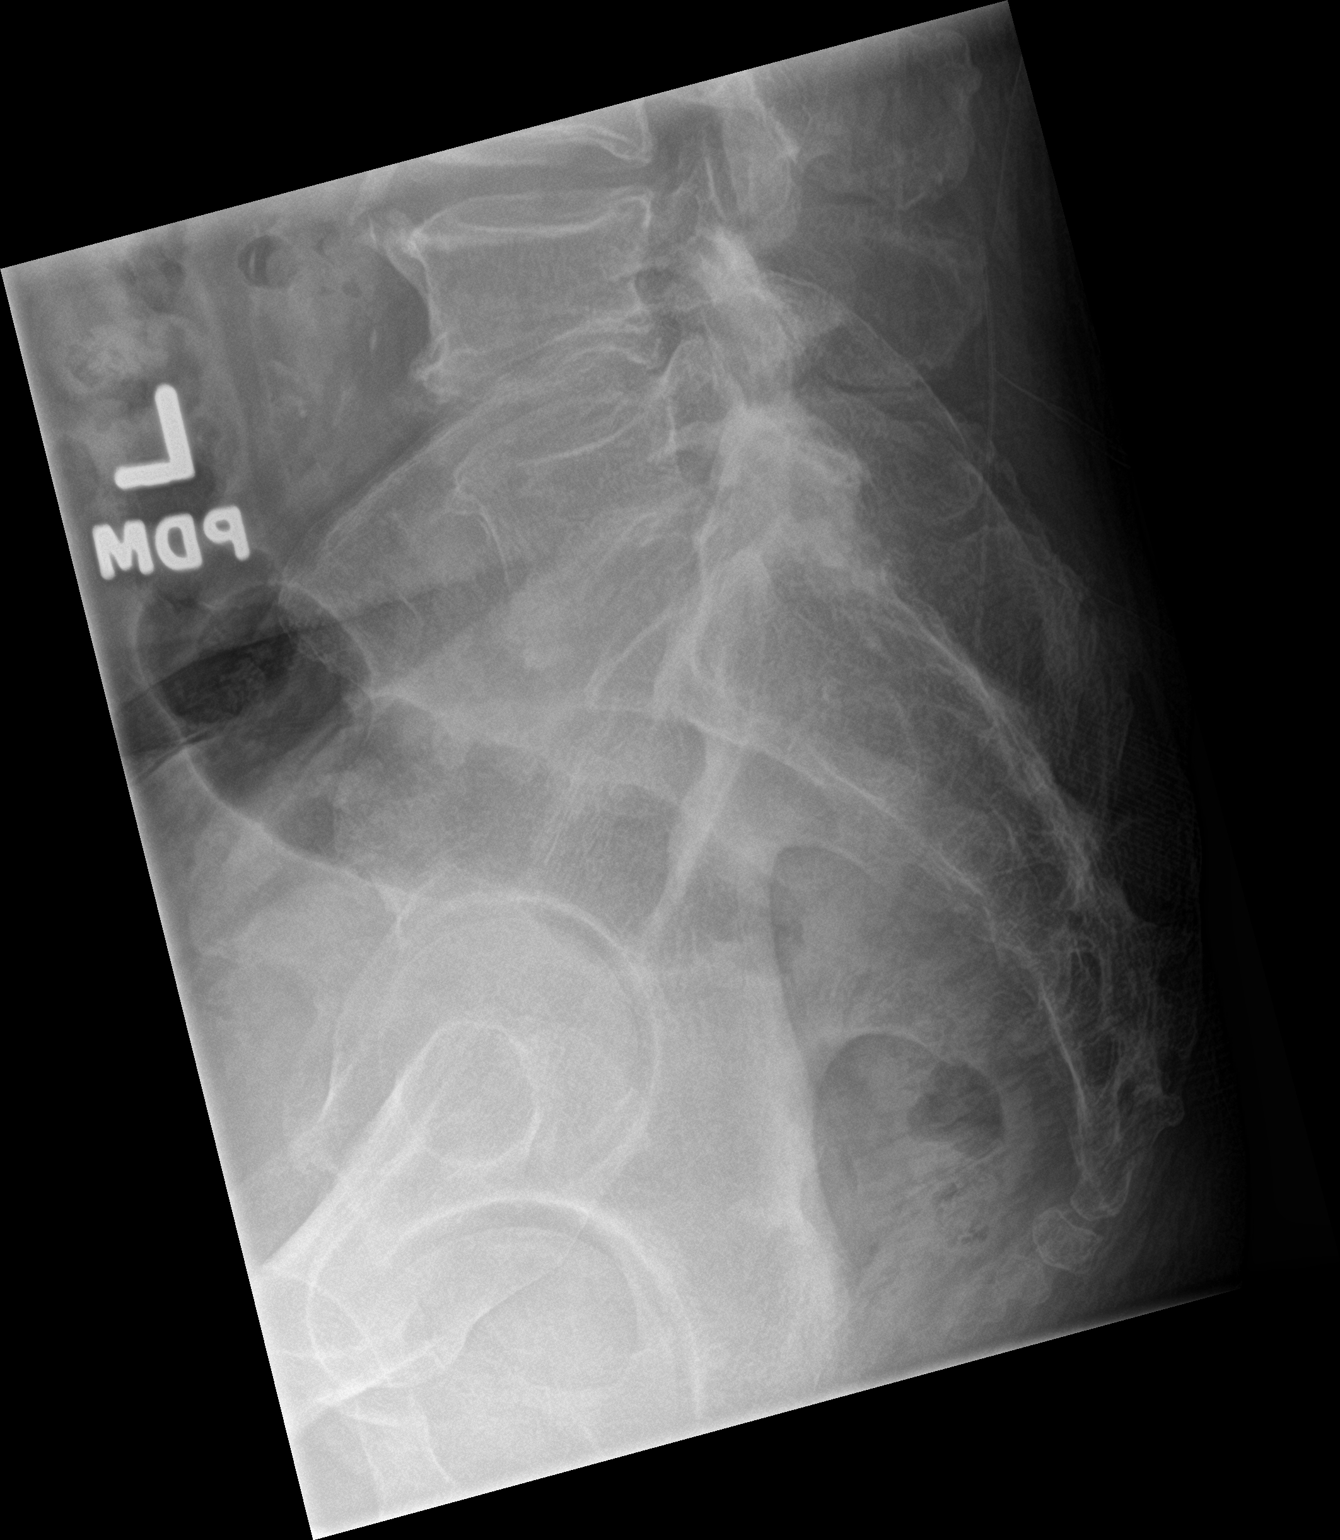

[3 of 3 positions shown; findings below may reference images not displayed]

FINDINGS: Mild degenerative spurring at each level of the lumbar spine.
Additional degenerative facet hypertrophy at the L3-4 through L5-S1
levels, mild to moderate in degree.

No acute or suspicious osseous finding. Atherosclerotic changes are
seen along the walls of the infrarenal abdominal aorta. Visualized
paravertebral soft tissues are otherwise unremarkable.
IMPRESSION: 1. No acute findings.
2. Degenerative spondylosis of the lumbar spine, mild to moderate in
degree, as detailed above.
3.  Aortic Atherosclerosis ([id number]).

## 2019-06-01 IMAGING — DX DG CERVICAL SPINE 2 OR 3 VIEWS
3 series · 3 of 3 positions shown · non-contrast
Comparison: None.

CLINICAL DATA: Neck pain rt > lt x 6 wks, NKI.

EXAM:
CERVICAL SPINE - 2-3 VIEW

[c-spine lat]
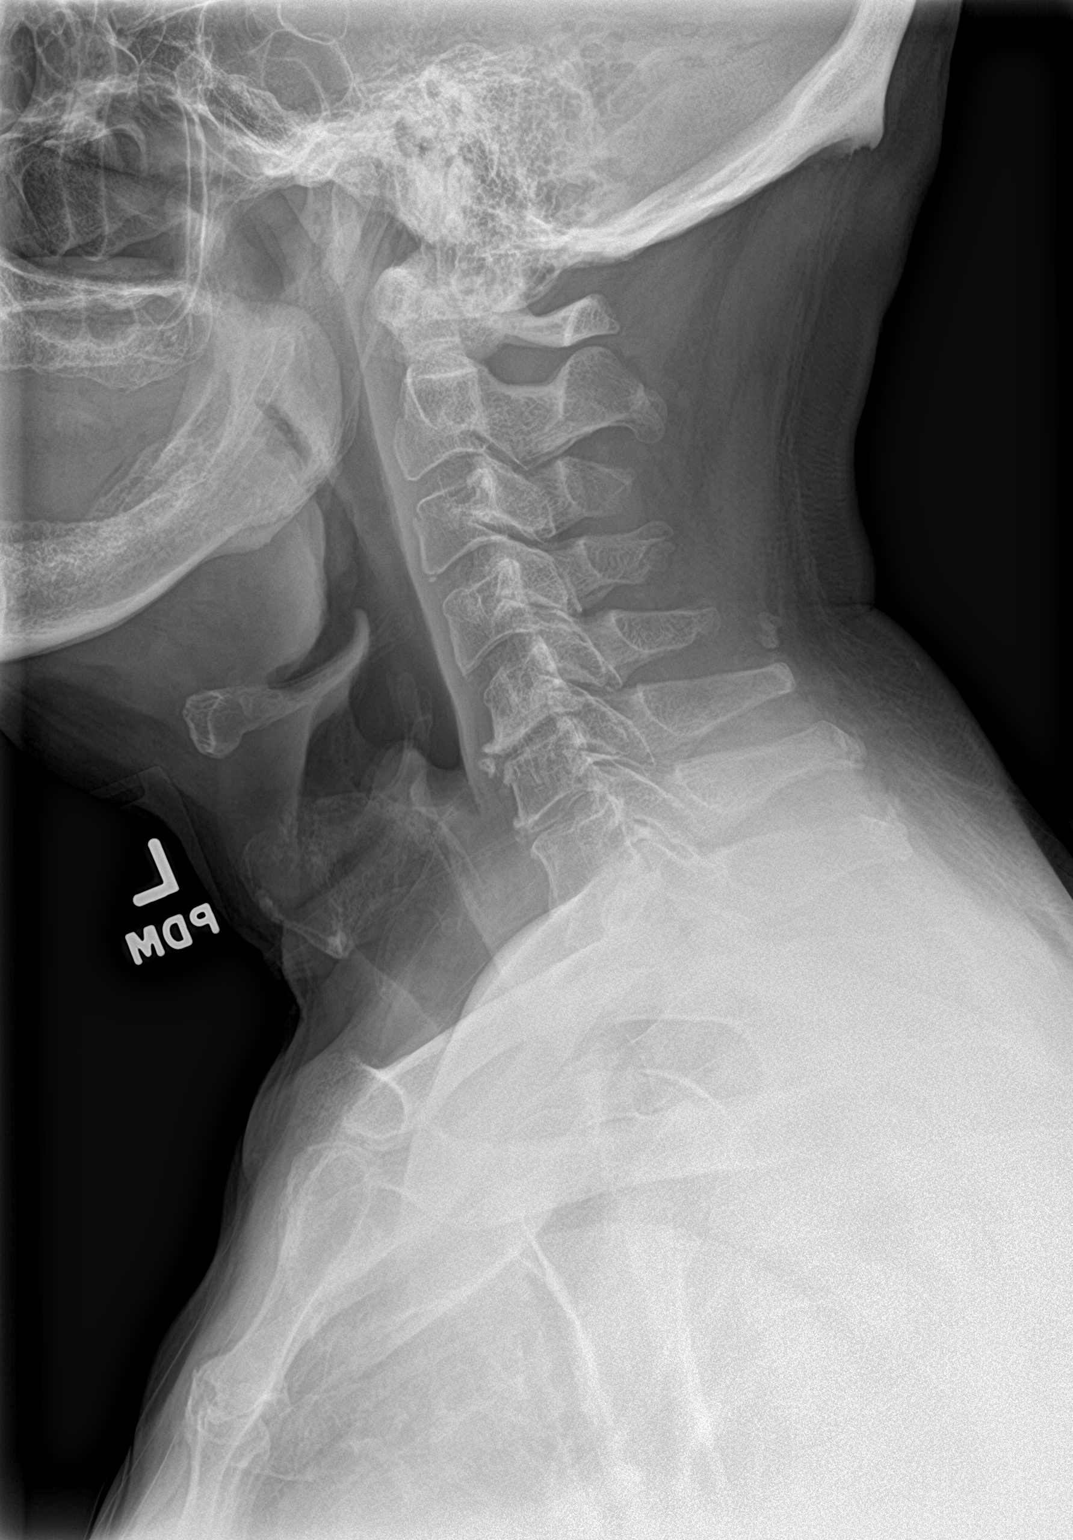

[c-spine ap]
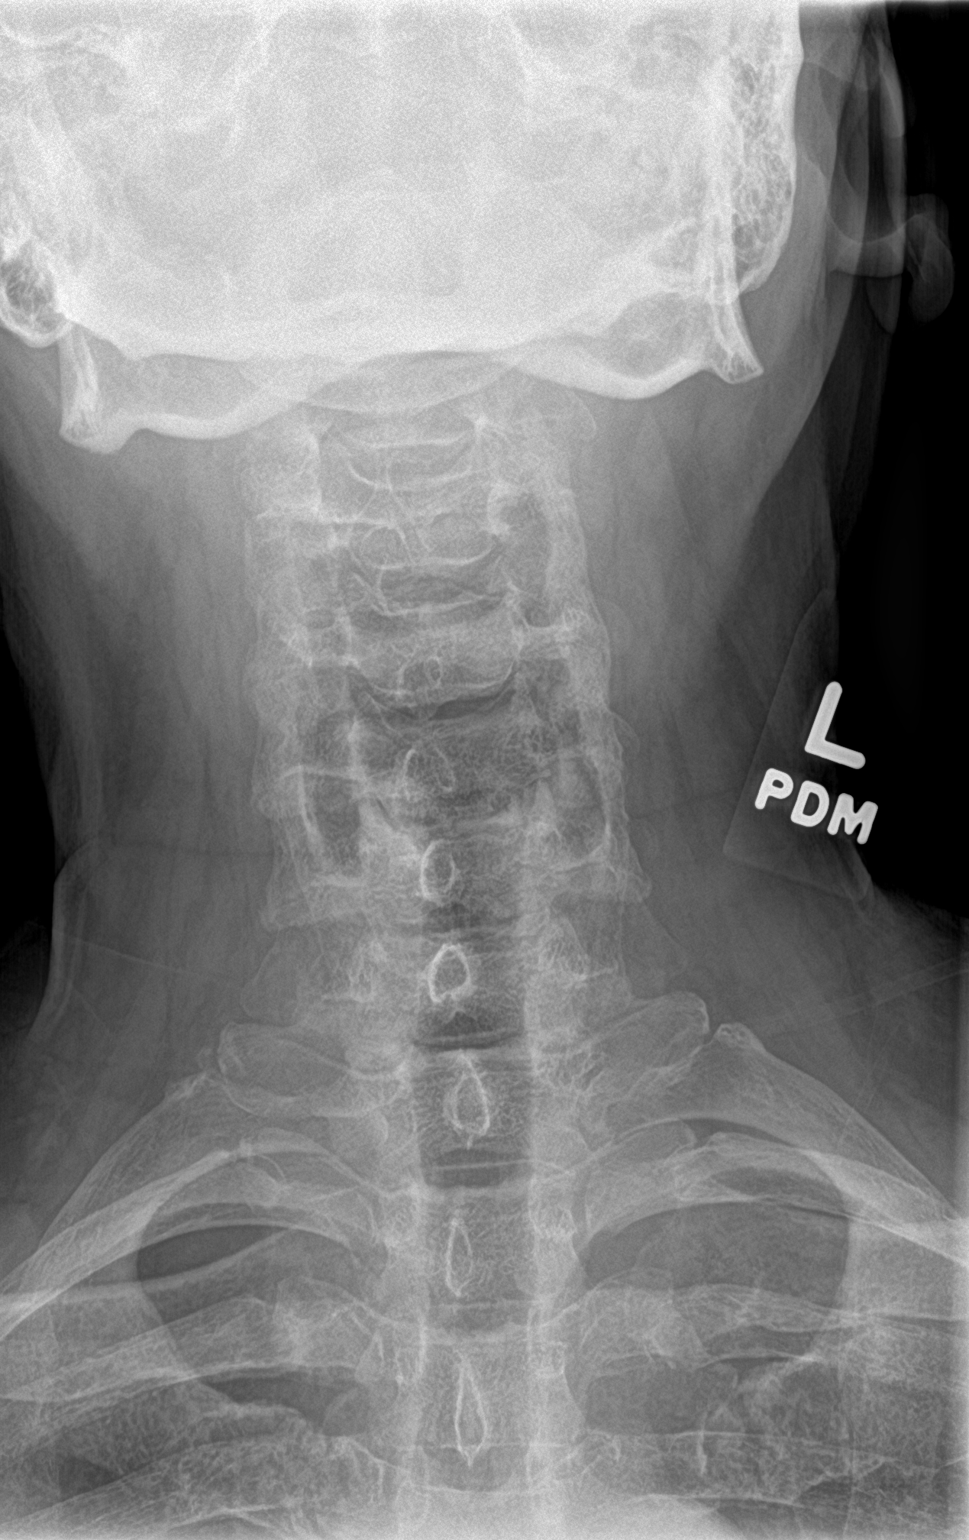

[c-spine open mouth]
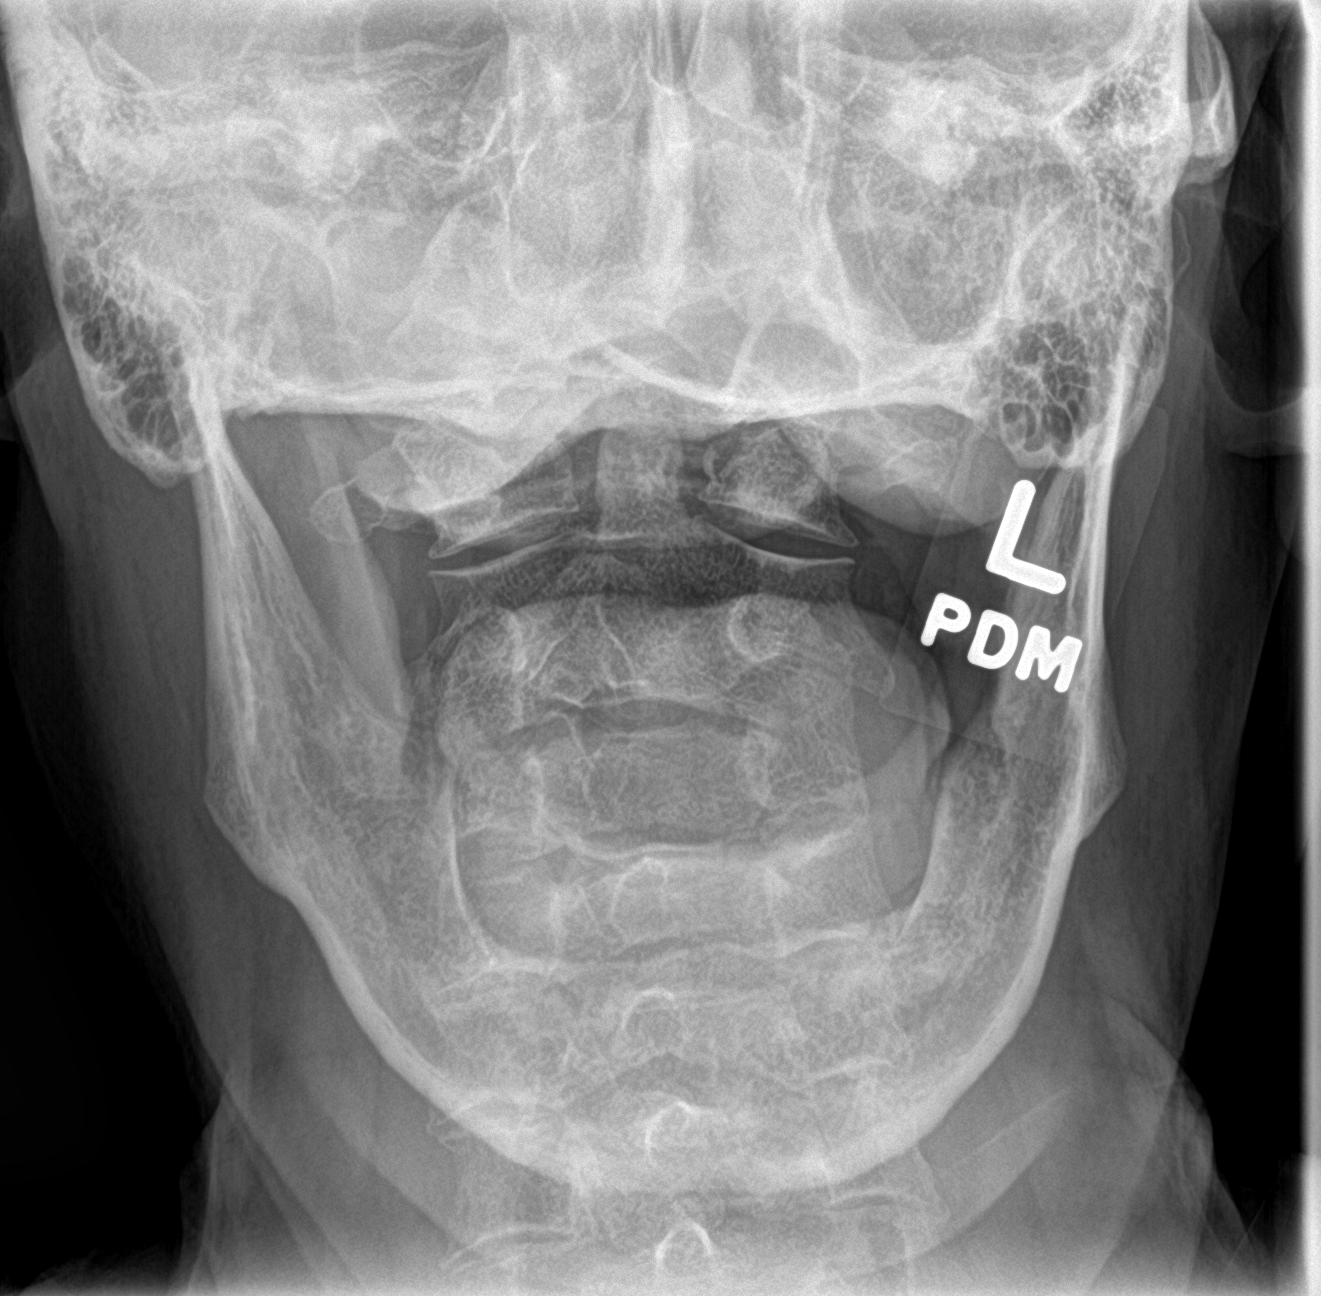

[3 of 3 positions shown; findings below may reference images not displayed]

FINDINGS: Mild degenerative spondylitic changes at the C5-6 and C6-7 levels,
with associated mild disc space narrowings and mild osseous
spurring. Slight retrolisthesis of C5 is related to the underlying
degenerative changes.

No acute or suspicious osseous finding. Prevertebral soft tissues
are normal in thickness. Visualized paravertebral soft tissues are
unremarkable.
IMPRESSION: 1. No acute findings.
2. Mild degenerative spondylosis within the lower cervical spine, as
detailed above.
